# Patient Record
Sex: Female | Born: 1985 | Hispanic: No | Marital: Married | Smoking: Never smoker
Health system: Southern US, Community
[De-identification: ages and names within clinical notes are randomized; demographics above are authoritative.]

## PROBLEM LIST (undated history)

## (undated) ENCOUNTER — Inpatient Hospital Stay (HOSPITAL_COMMUNITY): Payer: Self-pay

## (undated) DIAGNOSIS — E119 Type 2 diabetes mellitus without complications: Secondary | ICD-10-CM

## (undated) DIAGNOSIS — R1110 Vomiting, unspecified: Secondary | ICD-10-CM

## (undated) HISTORY — PX: DILATION AND CURETTAGE OF UTERUS: SHX78

## (undated) HISTORY — DX: Type 2 diabetes mellitus without complications: E11.9

---

## 2014-09-04 LAB — CYTOLOGY - PAP: PAP SMEAR: NEGATIVE

## 2014-09-04 LAB — OB RESULTS CONSOLE VARICELLA ZOSTER ANTIBODY, IGG: VARICELLA IGG: IMMUNE

## 2014-10-13 ENCOUNTER — Emergency Department (HOSPITAL_COMMUNITY)
Admission: EM | Admit: 2014-10-13 | Discharge: 2014-10-14 | Disposition: A | Discharge status: Home / Self Care | Payer: Medicaid Other | Source: Home / Self Care | Attending: Emergency Medicine | Admitting: Emergency Medicine

## 2014-10-13 ENCOUNTER — Emergency Department (HOSPITAL_COMMUNITY): Payer: Medicaid Other

## 2014-10-13 ENCOUNTER — Encounter (HOSPITAL_COMMUNITY): Payer: Self-pay | Admitting: Emergency Medicine

## 2014-10-13 DIAGNOSIS — Z3490 Encounter for supervision of normal pregnancy, unspecified, unspecified trimester: Secondary | ICD-10-CM

## 2014-10-13 DIAGNOSIS — O9989 Other specified diseases and conditions complicating pregnancy, childbirth and the puerperium: Secondary | ICD-10-CM

## 2014-10-13 DIAGNOSIS — R109 Unspecified abdominal pain: Secondary | ICD-10-CM | POA: Diagnosis present

## 2014-10-13 DIAGNOSIS — Z3A01 Less than 8 weeks gestation of pregnancy: Secondary | ICD-10-CM | POA: Insufficient documentation

## 2014-10-13 DIAGNOSIS — N898 Other specified noninflammatory disorders of vagina: Secondary | ICD-10-CM | POA: Diagnosis not present

## 2014-10-13 DIAGNOSIS — R1084 Generalized abdominal pain: Secondary | ICD-10-CM

## 2014-10-13 DIAGNOSIS — R12 Heartburn: Secondary | ICD-10-CM | POA: Diagnosis not present

## 2014-10-13 DIAGNOSIS — O26899 Other specified pregnancy related conditions, unspecified trimester: Secondary | ICD-10-CM

## 2014-10-13 DIAGNOSIS — O26891 Other specified pregnancy related conditions, first trimester: Secondary | ICD-10-CM | POA: Diagnosis not present

## 2014-10-13 LAB — COMPREHENSIVE METABOLIC PANEL
ALK PHOS: 51 U/L (ref 38–126)
ALT: 13 U/L — AB (ref 14–54)
ANION GAP: 6 (ref 5–15)
AST: 18 U/L (ref 15–41)
Albumin: 4.1 g/dL (ref 3.5–5.0)
BILIRUBIN TOTAL: 0.5 mg/dL (ref 0.3–1.2)
BUN: 10 mg/dL (ref 6–20)
CALCIUM: 9 mg/dL (ref 8.9–10.3)
CO2: 23 mmol/L (ref 22–32)
CREATININE: 0.57 mg/dL (ref 0.44–1.00)
Chloride: 106 mmol/L (ref 101–111)
Glucose, Bld: 154 mg/dL — ABNORMAL HIGH (ref 65–99)
Potassium: 3.4 mmol/L — ABNORMAL LOW (ref 3.5–5.1)
SODIUM: 135 mmol/L (ref 135–145)
TOTAL PROTEIN: 7.9 g/dL (ref 6.5–8.1)

## 2014-10-13 LAB — TYPE AND SCREEN
ABO/RH(D): O POS
ANTIBODY SCREEN: NEGATIVE

## 2014-10-13 LAB — CBC
HCT: 35.4 % — ABNORMAL LOW (ref 36.0–46.0)
HEMOGLOBIN: 11.6 g/dL — AB (ref 12.0–15.0)
MCH: 27.8 pg (ref 26.0–34.0)
MCHC: 32.8 g/dL (ref 30.0–36.0)
MCV: 84.7 fL (ref 78.0–100.0)
PLATELETS: 263 10*3/uL (ref 150–400)
RBC: 4.18 MIL/uL (ref 3.87–5.11)
RDW: 12.7 % (ref 11.5–15.5)
WBC: 7.9 10*3/uL (ref 4.0–10.5)

## 2014-10-13 LAB — HCG, QUANTITATIVE, PREGNANCY: HCG, BETA CHAIN, QUANT, S: 27173 m[IU]/mL — AB (ref <5)

## 2014-10-13 LAB — LIPASE, BLOOD: Lipase: 23 U/L (ref 22–51)

## 2014-10-13 MED ORDER — ONDANSETRON HCL 4 MG/2ML IJ SOLN
4.0000 mg | Freq: Once | INTRAMUSCULAR | Status: AC
  Administered 2014-10-13: 4 mg via INTRAVENOUS
  Filled 2014-10-13: qty 2

## 2014-10-13 MED ORDER — ACETAMINOPHEN 325 MG PO TABS
650.0000 mg | ORAL_TABLET | Freq: Once | ORAL | Status: AC
  Administered 2014-10-13: 650 mg via ORAL
  Filled 2014-10-13: qty 2

## 2014-10-13 NOTE — ED Provider Notes (Signed)
CSN: [id number]     Arrival date & time 10/13/14  1934 History   First MD Initiated Contact with Patient 10/13/14 1953     Chief Complaint  Patient presents with  . Abdominal Pain     (Consider location/radiation/quality/duration/timing/severity/associated sxs/prior Treatment) HPI  29 year old who presents with abdominal pain. She is G6 P4 at about 4-[redacted] weeks gestational age and otherwise healthy without prior abdominal surgeries. She reports that for the past 3-4 days she has had generalized abdominal discomfort with nausea. She has not had any fever, chills, night sweats, vomiting, diarrhea, dysuria, urinary frequency, vaginal discharge or vaginal bleeding. She reports having similar symptoms with a prior pregnancy, and wanted to make sure everything was okay. She is not currently established prenatal care for her current pregnancy, and had a positive urine pregnancy test at home recently.  History reviewed. No pertinent past medical history. History reviewed. No pertinent past surgical history. History reviewed. No pertinent family history. Social History  Substance Use Topics  . Smoking status: Never Smoker   . Smokeless tobacco: None  . Alcohol Use: No   OB History    No data available     Review of Systems 10/14 systems reviewed and are negative other than those stated in the HPI  Allergies  Review of patient's allergies indicates no known allergies.  Home Medications   Prior to Admission medications   Not on File   BP 90/50 mmHg  Pulse 73  Temp(Src) 98.5 F (36.9 C) (Oral)  Resp 20  SpO2 100%  LMP  (Approximate) Physical Exam Physical Exam  Nursing note and vitals reviewed. Constitutional: Well developed, well nourished, non-toxic, and in no acute distress Head: Normocephalic and atraumatic.  Mouth/Throat: Oropharynx is clear and moist.  Neck: Normal range of motion. Neck supple.  Cardiovascular: Normal rate and regular rhythm.   Pulmonary/Chest: Effort  normal and breath sounds normal.  Abdominal: Soft. There diffuse/generalized tenderness. There is no rebound and no guarding.  Musculoskeletal: Normal range of motion.  Neurological: Alert, no facial droop, fluent speech, moves all extremities symmetrically Skin: Skin is warm and dry.  Psychiatric: Cooperative  ED Course  Procedures (including critical care time) Labs Review Labs Reviewed  COMPREHENSIVE METABOLIC PANEL - Abnormal; Notable for the following:    Potassium 3.4 (*)    Glucose, Bld 154 (*)    ALT 13 (*)    All other components within normal limits  CBC - Abnormal; Notable for the following:    Hemoglobin 11.6 (*)    HCT 35.4 (*)    All other components within normal limits  HCG, QUANTITATIVE, PREGNANCY - Abnormal; Notable for the following:    hCG, Beta Chain, Sharene Butters, S [id number] (*)    All other components within normal limits  LIPASE, BLOOD  URINALYSIS, ROUTINE W REFLEX MICROSCOPIC (NOT AT [facility])  TYPE AND SCREEN  ABO/RH    Imaging Review US Ob Comp Less 14 Wks  10/13/2014   CLINICAL DATA:  Acute onset of generalized abdominal pain. Initial encounter.  EXAM: OBSTETRIC <14 WK [place] AND TRANSVAGINAL OB US  TECHNIQUE: Both transabdominal and transvaginal ultrasound examinations were performed for complete evaluation of the gestation as well as the maternal uterus, adnexal regions, and pelvic cul-de-sac. Transvaginal technique was performed to assess early pregnancy.  COMPARISON:  None.  FINDINGS: Intrauterine gestational sac: Visualized/normal in shape.  Yolk sac:  Yes  Embryo:  Yes  Cardiac Activity: Yes  Heart Rate: 118  bpm  CRL:  4.4  mm   6 w   1 d                  [place] EDC: 06/07/2014  Maternal uterus/adnexae: No subchorionic hemorrhage is noted. The uterus is otherwise unremarkable.  The ovaries are unremarkable in appearance. The right ovary measures 3.1 x 1.7 x 1.1 cm, while the left ovary measures 2.5 x 2.2 x 2.0 cm. No suspicious adnexal masses are seen; there is no evidence  for ovarian torsion.  No free fluid is seen within the pelvic cul-de-sac.  IMPRESSION: Single live intrauterine pregnancy noted, with a crown-rump length of 4 mm, corresponding to a gestational age of [redacted] weeks 1 day. This matches the gestational age of [redacted] weeks 0 days by LMP, reflecting an estimated date of delivery of June 01, 2015.   Electronically Signed   By: Roanna Raider M.D.   On: 10/13/2014 22:55   US Ob Transvaginal  10/13/2014   CLINICAL DATA:  Acute onset of generalized abdominal pain. Initial encounter.  EXAM: OBSTETRIC <14 WK [place] AND TRANSVAGINAL OB US  TECHNIQUE: Both transabdominal and transvaginal ultrasound examinations were performed for complete evaluation of the gestation as well as the maternal uterus, adnexal regions, and pelvic cul-de-sac. Transvaginal technique was performed to assess early pregnancy.  COMPARISON:  None.  FINDINGS: Intrauterine gestational sac: Visualized/normal in shape.  Yolk sac:  Yes  Embryo:  Yes  Cardiac Activity: Yes  Heart Rate: 118  bpm  CRL:  4.4  mm   6 w   1 d                  [place] EDC: 06/07/2014  Maternal uterus/adnexae: No subchorionic hemorrhage is noted. The uterus is otherwise unremarkable.  The ovaries are unremarkable in appearance. The right ovary measures 3.1 x 1.7 x 1.1 cm, while the left ovary measures 2.5 x 2.2 x 2.0 cm. No suspicious adnexal masses are seen; there is no evidence for ovarian torsion.  No free fluid is seen within the pelvic cul-de-sac.  IMPRESSION: Single live intrauterine pregnancy noted, with a crown-rump length of 4 mm, corresponding to a gestational age of [redacted] weeks 1 day. This matches the gestational age of [redacted] weeks 0 days by LMP, reflecting an estimated date of delivery of June 01, 2015.   Electronically Signed   By: Roanna Raider M.D.   On: 10/13/2014 22:55   I have personally reviewed and evaluated these images and lab results as part of my medical decision-making.    MDM   Final diagnoses:  Intrauterine pregnancy   Generalized abdominal pain    29 year old female G6 P4 and reported [redacted] weeks gestational age who presents with generalized abdominal pain and nausea. She is nontoxic in no acute distress with normal vital signs on arrival. She has a soft and benign abdomen. Basic blood work including CBC, comprehensive metabolic panel, lipase, and urinalysis are unremarkable. Pelvic ultrasound was obtained to rule out ectopic pregnancy, and she has a IUP at 6 weeks and one day without any other acute intrapelvic processes. No evidence of vaginal bleeding. On reevaluation she continues to have a benign abdomen with symptoms improved after Tylenol and Zofran. She is felt appropriate for discharge home. Given referral for OB follow-up to establish prenatal care. Strict return and follow-up instructions are reviewed. She expressed understanding of all discharge instructions for comfortable to plan of care.    Lavera Guise, MD 10/13/14 [phone]

## 2014-10-13 NOTE — Discharge Instructions (Signed)
You have a baby in your uterus at 6 weeks and one day. Please return without fail for worsening symptoms, including worsening pain, vomiting unable to keep down fluids, fever, vaginal bleeding, or any other symptoms concerning to you.  Abdominal Pain During Pregnancy Belly (abdominal) pain is common during pregnancy. Most of the time, it is not a serious problem. Other times, it can be a sign that something is wrong with the pregnancy. Always tell your doctor if you have belly pain. HOME CARE Monitor your belly pain for any changes. The following actions may help you feel better:  Do not have sex (intercourse) or put anything in your vagina until you feel better.  Rest until your pain stops.  Drink clear fluids if you feel sick to your stomach (nauseous). Do not eat solid food until you feel better.  Only take medicine as told by your doctor.  Keep all doctor visits as told. GET HELP RIGHT AWAY IF:   You are bleeding, leaking fluid, or pieces of tissue come out of your vagina.  You have more pain or cramping.  You keep throwing up (vomiting).  You have pain when you pee (urinate) or have blood in your pee.  You have a fever.  You do not feel your baby moving as much.  You feel very weak or feel like passing out.  You have trouble breathing, with or without belly pain.  You have a very bad headache and belly pain.  You have fluid leaking from your vagina and belly pain.  You keep having watery poop (diarrhea).  Your belly pain does not go away after resting, or the pain gets worse. MAKE SURE YOU:   Understand these instructions.  Will watch your condition.  Will get help right away if you are not doing well or get worse. Document Released: 01/20/2009 Document Revised: 10/04/2012 Document Reviewed: 08/31/2012 [facility] Patient Information 2015 [place], [place]. This information is not intended to replace advice given to you by your health care provider. Make sure you  discuss any questions you have with your health care provider.

## 2014-10-13 NOTE — ED Notes (Signed)
Patient unable to void at this time

## 2014-10-13 NOTE — ED Notes (Signed)
Pt from home c/o sharp  abdominal pain x 4 days ,pt is pregnant and reports her LMP was 1 month ago. She reports sharp pain in lower abdomen. She reports taking a home pregnancy test but has not seen a doctor. Denies vaginal bleeding or discharge. Pt speaks arabic

## 2014-10-13 NOTE — ED Notes (Signed)
Pt transported to US

## 2014-10-13 NOTE — ED Notes (Signed)
Bed: ZO10 Expected date:  Expected time:  Means of arrival:  Comments: t1

## 2014-10-13 NOTE — ED Notes (Signed)
Patient is at [facility] sound, i am not able to take assess patients vitals at this time.

## 2014-10-14 ENCOUNTER — Inpatient Hospital Stay (HOSPITAL_COMMUNITY)
Admission: AD | Admit: 2014-10-14 | Discharge: 2014-10-14 | Disposition: A | Discharge status: Home / Self Care | Payer: Medicaid Other | Source: Ambulatory Visit | Attending: Family Medicine | Admitting: Family Medicine

## 2014-10-14 ENCOUNTER — Encounter (HOSPITAL_COMMUNITY): Payer: Self-pay | Admitting: *Deleted

## 2014-10-14 DIAGNOSIS — N898 Other specified noninflammatory disorders of vagina: Secondary | ICD-10-CM | POA: Insufficient documentation

## 2014-10-14 DIAGNOSIS — O26891 Other specified pregnancy related conditions, first trimester: Secondary | ICD-10-CM

## 2014-10-14 DIAGNOSIS — Z3A01 Less than 8 weeks gestation of pregnancy: Secondary | ICD-10-CM | POA: Insufficient documentation

## 2014-10-14 DIAGNOSIS — R12 Heartburn: Secondary | ICD-10-CM | POA: Diagnosis not present

## 2014-10-14 DIAGNOSIS — O9989 Other specified diseases and conditions complicating pregnancy, childbirth and the puerperium: Secondary | ICD-10-CM | POA: Insufficient documentation

## 2014-10-14 LAB — URINE MICROSCOPIC-ADD ON

## 2014-10-14 LAB — URINALYSIS, ROUTINE W REFLEX MICROSCOPIC
Bilirubin Urine: NEGATIVE
GLUCOSE, UA: NEGATIVE mg/dL
HGB URINE DIPSTICK: NEGATIVE
KETONES UR: NEGATIVE mg/dL
Nitrite: NEGATIVE
PROTEIN: NEGATIVE mg/dL
Specific Gravity, Urine: 1.01 (ref 1.005–1.030)
Urobilinogen, UA: 0.2 mg/dL (ref 0.0–1.0)
pH: 5.5 (ref 5.0–8.0)

## 2014-10-14 LAB — ABO/RH: ABO/RH(D): O POS

## 2014-10-14 MED ORDER — FAMOTIDINE 40 MG PO TABS: 40.0000 mg | ORAL_TABLET | Freq: Every day | ORAL | Status: DC

## 2014-10-14 MED ORDER — METOCLOPRAMIDE HCL 5 MG/ML IJ SOLN
10.0000 mg | Freq: Once | INTRAMUSCULAR | Status: AC
  Administered 2014-10-14: 10 mg via INTRAVENOUS
  Filled 2014-10-14: qty 2

## 2014-10-14 MED ORDER — GI COCKTAIL ~~LOC~~
30.0000 mL | Freq: Once | ORAL | Status: AC
  Administered 2014-10-14: 30 mL via ORAL
  Filled 2014-10-14: qty 30

## 2014-10-14 MED ORDER — METOCLOPRAMIDE HCL 10 MG PO TABS
10.0000 mg | ORAL_TABLET | Freq: Once | ORAL | Status: DC
  Filled 2014-10-14: qty 1

## 2014-10-14 MED ORDER — LACTATED RINGERS IV SOLN: INTRAVENOUS | Status: DC

## 2014-10-14 MED ORDER — DEXAMETHASONE SODIUM PHOSPHATE 10 MG/ML IJ SOLN
10.0000 mg | Freq: Once | INTRAMUSCULAR | Status: AC
  Administered 2014-10-14: 10 mg via INTRAVENOUS
  Filled 2014-10-14: qty 1

## 2014-10-14 MED ORDER — LACTATED RINGERS IV SOLN
Freq: Once | INTRAVENOUS | Status: AC
  Administered 2014-10-14: 18:00:00 via INTRAVENOUS
  Filled 2014-10-14: qty 1000

## 2014-10-14 MED ORDER — DEXTROSE 5 % IN LACTATED RINGERS IV BOLUS: 1000.0000 mL | Freq: Once | INTRAVENOUS | Status: DC

## 2014-10-14 NOTE — MAU Provider Note (Signed)
History     CSN: [id number]  Arrival date and time: 10/14/14 1634   First Provider Initiated Contact with Patient 10/14/14 1713      Chief Complaint  Patient presents with  . Abdominal Pain   HPI Comments: 29 yo [id number] @[redacted]w[redacted]d  [place] Arabic, been in country [place] 3 months.   Presents with upper abdominal pain- nausea but no vomiting.  Pt states she has had constipation with last BM 2 days ago, which was hard. Sometimes eating helps the pain. Pt was seen 10/13/2014 (yesterday) with confirmed IUP [redacted]w[redacted]d with HR 118 and normal findings. Pt also had a CBC. CMET and lipase which were all normal. (interpreter present with HPI) Pt also has had some vaginal discharge- denies spotting or bleeding or lower abd cramping  Abdominal Pain This is a new problem. The current episode started yesterday. The onset quality is gradual. The problem occurs constantly. The problem has been unchanged. The pain is located in the LUQ and RUQ. The pain is moderate. The quality of the pain is burning. The abdominal pain does not radiate. Associated symptoms include constipation, headaches, nausea and vomiting. Pertinent negatives include no diarrhea, dysuria or fever. Nothing aggravates the pain. The pain is relieved by nothing. She has tried nothing for the symptoms.    RN note: Job Founds Spurlock-Frizzell, RN Registered Nurse Signed  MAU Note 10/14/2014 4:50 PM    Expand All Collapse All   Severe pain in upper abd, pain started about 7 days ago. Also has a severe headache. Denies vomiting or diarrhea. Is nauseated. Has been dizzy.        Past Medical History  Diagnosis Date  . Medical history non-contributory     Past Surgical History  Procedure Laterality Date  . No past surgeries      History reviewed. No pertinent family history.  Social History  Substance Use Topics  . Smoking status: Never Smoker   . Smokeless tobacco: None  . Alcohol Use: No    Allergies: No Known Allergies  No  prescriptions prior to admission    Review of Systems  Constitutional: Negative for fever and chills.  Gastrointestinal: Positive for heartburn, nausea, vomiting, abdominal pain and constipation. Negative for diarrhea.  Genitourinary: Negative for dysuria and urgency.  Neurological: Positive for dizziness and headaches.   Physical Exam   Blood pressure 93/57, pulse 85, temperature 98.7 F (37.1 C), temperature source Oral, resp. rate 18, last menstrual period 09/01/2014.  Physical Exam  Nursing note and vitals reviewed. Constitutional: She is oriented to person, place, and time. She appears well-developed and well-nourished. No distress.  HENT:  Head: Normocephalic.  Eyes: Pupils are equal, round, and reactive to light.  Neck: Normal range of motion. Neck supple.  Cardiovascular: Normal rate.   Respiratory: Effort normal.  GI: Soft. She exhibits no distension. There is tenderness. There is no rebound and no guarding.  Genitourinary:  Blind swab for GC/chlamydia and wet prep  Musculoskeletal: Normal range of motion.  Neurological: She is alert and oriented to person, place, and time.  Skin: Skin is warm and dry. There is pallor.  Psychiatric: She has a normal mood and affect.     MAU Course  Procedures GI cocktail helped abdominal pain Still has headache and nausea IVF LR with phenergan Decadron 10mg  IV and Reglan IV given Pt dizzy - will give 2nd bag of IVF D5LR   [facility], CNM 2130: Patient's second bag of fluids is done. She is  sleeping comfortably at this time.   Assessment and Plan   1. Heartburn during pregnancy in first trimester    DC home Comfort measures reviewed  1st Trimester precautions  Bleeding precautions Ectopic precautions RX: pepcid 40 mg #30  Return to MAU as needed FU with OB as planned  Follow-up Information    Schedule an appointment as soon as possible for a visit with [facility] DEPT [place].   Contact  information:   [address] [facility] [place] [address] [phone]      Tawnya Crook  8:37 PM 10/14/2014   LINEBERRY,SUSAN 10/14/2014, 5:15 PM

## 2014-10-14 NOTE — MAU Note (Signed)
Severe pain in upper abd, pain started about 7 days ago.  Also has a severe headache.  Denies vomiting or diarrhea.  Is nauseated.  Has been dizzy.

## 2014-10-14 NOTE — MAU Note (Signed)
Has been in the states 3 months

## 2014-10-14 NOTE — ED Notes (Signed)
Patient was alert, oriented and stable upon discharge. RN went over AVS and patient had no further questions. Translator phone used to discharge. MD also spoke to patient with translator phone.

## 2014-10-14 NOTE — Discharge Instructions (Signed)
Heartburn During Pregnancy ° Heartburn happens when stomach acid goes up into the esophagus. The esophagus is the tube between the mouth and the stomach. This acid causes a burning pain in the chest or throat. This happens more often in the later part of pregnancy because the womb (uterus) gets larger. It may also happen because of hormone changes. Heartburn problems often go away after giving birth. °HOME CARE °· Take all medicine as told by your doctor. °· Raise the head of your bed with blocks only as told by your doctor. °· Do not exercise right after eating. °· Avoid eating 2-3 hours before bed. Do not lie down right after eating. °· Eat small meals throughout the day instead of 3 large meals. °· Avoid foods that give you heartburn. Foods you may want to avoid include: °¨ Peppers. °¨ Chocolate. °¨ High-fat foods, including fried foods. °¨ Spicy foods. °¨ Garlic and onions. °¨ Citrus fruits, including oranges, grapefruit, lemons, and limes. °¨ Food containing tomatoes or tomato products. °¨ Mint. °¨ Bubbly (carbonated) drinks and drinks with caffeine. °¨ Vinegar. °GET HELP IF: °· You have any belly (abdominal) pain. °· You feel burning in your upper belly or chest, especially after eating or lying down. °· You feel sick to your stomach (nauseous) and throw up (vomit). °· Your stomach feels upset after you eat. °GET HELP RIGHT AWAY IF: °· You have bad chest pain that goes down your arm or into your jaw or neck. °· You feel sweaty, dizzy, or light-headed. °· You have trouble breathing. °· You throw up blood. °· You have trouble or pain when swallowing. °· You have bloody or black poop (stool). °· You have heartburn more than 3 times a week, for more than 2 weeks. °MAKE SURE YOU: °· Understand these instructions. °· Will watch your condition. °· Will get help right away if you are not doing well or get worse. °Document Released: 03/06/2010 Document Revised: 02/06/2013 Document Reviewed: 09/20/2012 °[employer]®  Patient Information ©2015 [employer]. This information is not intended to replace advice given to you by your health care provider. Make sure you discuss any questions you have with your health care provider. ° °

## 2014-10-14 NOTE — MAU Note (Signed)
Did not check temp, feels hot and 'eyes are burning'

## 2014-10-17 ENCOUNTER — Inpatient Hospital Stay (HOSPITAL_COMMUNITY)
Admission: AD | Admit: 2014-10-17 | Discharge: 2014-10-17 | Disposition: A | Discharge status: Home / Self Care | Payer: Medicaid Other | Source: Ambulatory Visit | Attending: Obstetrics & Gynecology | Admitting: Obstetrics & Gynecology

## 2014-10-17 DIAGNOSIS — Z3A01 Less than 8 weeks gestation of pregnancy: Secondary | ICD-10-CM | POA: Insufficient documentation

## 2014-10-17 DIAGNOSIS — O9989 Other specified diseases and conditions complicating pregnancy, childbirth and the puerperium: Secondary | ICD-10-CM | POA: Diagnosis not present

## 2014-10-17 DIAGNOSIS — O210 Mild hyperemesis gravidarum: Secondary | ICD-10-CM | POA: Diagnosis not present

## 2014-10-17 DIAGNOSIS — R1010 Upper abdominal pain, unspecified: Secondary | ICD-10-CM | POA: Diagnosis present

## 2014-10-17 DIAGNOSIS — R109 Unspecified abdominal pain: Secondary | ICD-10-CM | POA: Diagnosis not present

## 2014-10-17 DIAGNOSIS — R51 Headache: Secondary | ICD-10-CM | POA: Insufficient documentation

## 2014-10-17 DIAGNOSIS — O26899 Other specified pregnancy related conditions, unspecified trimester: Secondary | ICD-10-CM

## 2014-10-17 DIAGNOSIS — O219 Vomiting of pregnancy, unspecified: Secondary | ICD-10-CM

## 2014-10-17 LAB — COMPREHENSIVE METABOLIC PANEL
ALK PHOS: 51 U/L (ref 38–126)
ALT: 42 U/L (ref 14–54)
ANION GAP: 11 (ref 5–15)
AST: 29 U/L (ref 15–41)
Albumin: 4.1 g/dL (ref 3.5–5.0)
BILIRUBIN TOTAL: 0.8 mg/dL (ref 0.3–1.2)
BUN: 9 mg/dL (ref 6–20)
CALCIUM: 9.4 mg/dL (ref 8.9–10.3)
CO2: 22 mmol/L (ref 22–32)
CREATININE: 0.56 mg/dL (ref 0.44–1.00)
Chloride: 101 mmol/L (ref 101–111)
Glucose, Bld: 95 mg/dL (ref 65–99)
Potassium: 3.6 mmol/L (ref 3.5–5.1)
Sodium: 134 mmol/L — ABNORMAL LOW (ref 135–145)
TOTAL PROTEIN: 7.8 g/dL (ref 6.5–8.1)

## 2014-10-17 LAB — CBC
HEMATOCRIT: 35.8 % — AB (ref 36.0–46.0)
HEMOGLOBIN: 12.1 g/dL (ref 12.0–15.0)
MCH: 28.2 pg (ref 26.0–34.0)
MCHC: 33.8 g/dL (ref 30.0–36.0)
MCV: 83.4 fL (ref 78.0–100.0)
Platelets: 279 10*3/uL (ref 150–400)
RBC: 4.29 MIL/uL (ref 3.87–5.11)
RDW: 12.9 % (ref 11.5–15.5)
WBC: 9.5 10*3/uL (ref 4.0–10.5)

## 2014-10-17 LAB — GLUCOSE, CAPILLARY: Glucose-Capillary: 90 mg/dL (ref 65–99)

## 2014-10-17 LAB — URINALYSIS, ROUTINE W REFLEX MICROSCOPIC
Bilirubin Urine: NEGATIVE
GLUCOSE, UA: NEGATIVE mg/dL
HGB URINE DIPSTICK: NEGATIVE
KETONES UR: 15 mg/dL — AB
Leukocytes, UA: NEGATIVE
Nitrite: NEGATIVE
PH: 7 (ref 5.0–8.0)
PROTEIN: NEGATIVE mg/dL
Specific Gravity, Urine: 1.02 (ref 1.005–1.030)
Urobilinogen, UA: 2 mg/dL — ABNORMAL HIGH (ref 0.0–1.0)

## 2014-10-17 MED ORDER — PROMETHAZINE HCL 25 MG/ML IJ SOLN
25.0000 mg | Freq: Once | INTRAVENOUS | Status: AC
  Administered 2014-10-17: 25 mg via INTRAVENOUS
  Filled 2014-10-17: qty 1

## 2014-10-17 MED ORDER — PROMETHAZINE HCL 25 MG PO TABS: 25.0000 mg | ORAL_TABLET | Freq: Four times a day (QID) | ORAL | Status: DC | PRN

## 2014-10-17 MED ORDER — METOCLOPRAMIDE HCL 5 MG/ML IJ SOLN
10.0000 mg | Freq: Once | INTRAMUSCULAR | Status: AC
  Administered 2014-10-17: 10 mg via INTRAVENOUS
  Filled 2014-10-17: qty 2

## 2014-10-17 MED ORDER — ACETAMINOPHEN 650 MG RE SUPP
650.0000 mg | Freq: Once | RECTAL | Status: AC
  Administered 2014-10-17: 650 mg via RECTAL
  Filled 2014-10-17: qty 1

## 2014-10-17 NOTE — MAU Note (Signed)
Discussed discharge instructions with patient and husband via Arabic interpreter.  Verbalized understanding.  Husband voiced concerns about patient having pain come back after IV fluids and medications.  Thressa Sheller, CNM at bedside to answer questions for patient and husband via interpreter.  Verbalized understanding and discharged to home with printed AVS.  Patient to pick up Rx tomorrow.

## 2014-10-17 NOTE — MAU Note (Signed)
Patient was seen in MAU on 10/14/14 for upper abdominal pain and returns today with the same c/o upper abdominal pain and nausea.  Denies vaginal bleeding and denies lower abdominal pain.  Pt speaks Arabic and husband translating.  Will call interpreter.

## 2014-10-17 NOTE — Discharge Instructions (Signed)
Get your prescription for phenergan at the pharmacy. Can use 1/2 tablet if that works to resolve your symptoms. Can use the tablets in the rectum if you are not able to keep them down by mouth.  Start prenatal care as soon as possible. Eat small, frequent amounts to decrease the nausea.

## 2014-10-17 NOTE — MAU Provider Note (Signed)
History     CSN: [id number]  Arrival date and time: 10/17/14 1601   First Provider Initiated Contact with Patient 10/17/14 1647      Chief Complaint  Patient presents with  . Abdominal Pain   HPI Bonnie [name] 29 y.o. [redacted] weeks gestation   Was here on 10-14-14 and seen in MAU with upper abdominal pain.  She has taken the prescribed pepcid for 2 days and the upper abdominal pain has not improved.  She has constant upper abdominal pain which periodically worsens for a few minutes.  She is having nausea and has vomited twice today.  She continues to have a headache today. She has not taken any additional food since she vomited near lunch.  She states she had pain similar to this with her last pregnancy but this pain is much worse.  No BM today.  Denies any diarrhea and vaginal bleeding.  She is concerned as she is not able to take care of her children while she is having this pain.  Chart notes, labs, and ultrasound reviewed from visit on 10-14-14.  Language line used for entire provider exam and interview.  OB History    Gravida Para Term Preterm AB TAB SAB Ectopic Multiple Living   6 4 4  1     4       Past Medical History  Diagnosis Date  . Medical history non-contributory     Past Surgical History  Procedure Laterality Date  . No past surgeries      No family history on file.  Social History  Substance Use Topics  . Smoking status: Never Smoker   . Smokeless tobacco: Not on file  . Alcohol Use: No    Allergies: No Known Allergies  Prescriptions prior to admission  Medication Sig Dispense Refill Last Dose  . famotidine (PEPCID) 40 MG tablet Take 1 tablet (40 mg total) by mouth daily. 30 tablet 1 10/17/2014 at Unknown time    Review of Systems  Constitutional: Negative for fever.  Gastrointestinal: Positive for nausea, vomiting and abdominal pain. Negative for diarrhea.  Genitourinary:       No vaginal discharge. No vaginal bleeding. No dysuria.  Neurological: Positive  for headaches.   Physical Exam   Blood pressure 99/55, pulse 74, temperature 99 F (37.2 C), temperature source Oral, resp. rate 16, height 5' 4.57" (1.64 m), weight 139 lb 3.2 oz (63.141 kg), last menstrual period 09/01/2014, SpO2 100 %.  Physical Exam  Nursing note and vitals reviewed. Constitutional: She is oriented to person, place, and time. She appears well-developed and well-nourished. She appears distressed.  HENT:  Head: Normocephalic.  Eyes: EOM are normal.  Neck: Neck supple.  GI: Soft. There is tenderness. There is no rebound and no guarding.  Pain is in central upper abdomen and extends to each side to midclavicular line.  No burning behind the sternum.  Musculoskeletal: Normal range of motion.  Neurological: She is alert and oriented to person, place, and time.  Skin: Skin is warm and dry.  Psychiatric: She has a normal mood and affect.    MAU Course  Procedures Results for orders placed or performed during the hospital encounter of 10/17/14 (from the past 24 hour(s))  Urinalysis, Routine w reflex microscopic (not at [facility])     Status: Abnormal   Collection Time: 10/17/14  4:10 PM  Result Value Ref Range   Color, Urine YELLOW YELLOW   APPearance CLEAR CLEAR   Specific Gravity, Urine 1.020 1.005 -  1.030   pH 7.0 5.0 - 8.0   Glucose, UA NEGATIVE NEGATIVE mg/dL   Hgb urine dipstick NEGATIVE NEGATIVE   Bilirubin Urine NEGATIVE NEGATIVE   Ketones, ur 15 (A) NEGATIVE mg/dL   Protein, ur NEGATIVE NEGATIVE mg/dL   Urobilinogen, UA 2.0 (H) 0.0 - 1.0 mg/dL   Nitrite NEGATIVE NEGATIVE   Leukocytes, UA NEGATIVE NEGATIVE  Glucose, capillary     Status: None   Collection Time: 10/17/14  5:17 PM  Result Value Ref Range   Glucose-Capillary 90 65 - 99 mg/dL  CBC     Status: Abnormal   Collection Time: 10/17/14  5:30 PM  Result Value Ref Range   WBC 9.5 4.0 - 10.5 K/uL   RBC 4.29 3.87 - 5.11 MIL/uL   Hemoglobin 12.1 12.0 - 15.0 g/dL   HCT 16.1 (L) 09.6 - 04.5 %   MCV  83.4 78.0 - 100.0 fL   MCH 28.2 26.0 - 34.0 pg   MCHC 33.8 30.0 - 36.0 g/dL   RDW 40.9 81.1 - 91.4 %   Platelets 279 150 - 400 K/uL  Comprehensive metabolic panel     Status: Abnormal   Collection Time: 10/17/14  5:30 PM  Result Value Ref Range   Sodium 134 (L) 135 - 145 mmol/L   Potassium 3.6 3.5 - 5.1 mmol/L   Chloride 101 101 - 111 mmol/L   CO2 22 22 - 32 mmol/L   Glucose, Bld 95 65 - 99 mg/dL   BUN 9 6 - 20 mg/dL   Creatinine, Ser 7.82 0.44 - 1.00 mg/dL   Calcium 9.4 8.9 - 95.6 mg/dL   Total Protein 7.8 6.5 - 8.1 g/dL   Albumin 4.1 3.5 - 5.0 g/dL   AST 29 15 - 41 U/L   ALT 42 14 - 54 U/L   Alkaline Phosphatase 51 38 - 126 U/L   Total Bilirubin 0.8 0.3 - 1.2 mg/dL   GFR calc non Af Amer >60 >60 mL/min   GFR calc Af Amer >60 >60 mL/min   Anion gap 11 5 - 15    MDM CBG today is 90.  Explained via language line - IVF with phenergan, reglan and acetaminophen suppository. Reviewed plan with Dr. Adrian Blackwater.  1930  Abdominal pain has resolved with Phenergan and reglan.  Slept with IVF.   Assessment and Plan  Upper abdominal pain possibly related to morning sickness Vomiting in early pregnancy Headache  Plan Will go home Try to eat small frequent amounts. Continue Pepcid. Rx Phenergan 25 mg One by mouth every 6 hours.  May use 1/2 tablet if that helps her symptoms.  May use in the rectum if vomiting. Begin prenatal care as soon as possible. Tal Neer 10/17/2014, 6:00 PM

## 2014-10-23 ENCOUNTER — Inpatient Hospital Stay (HOSPITAL_COMMUNITY)
Admission: AD | Admit: 2014-10-23 | Discharge: 2014-10-23 | Disposition: A | Discharge status: Home / Self Care | Payer: Medicaid Other | Source: Ambulatory Visit | Attending: Obstetrics and Gynecology | Admitting: Obstetrics and Gynecology

## 2014-10-23 ENCOUNTER — Encounter (HOSPITAL_COMMUNITY): Payer: Self-pay | Admitting: Student

## 2014-10-23 DIAGNOSIS — O219 Vomiting of pregnancy, unspecified: Secondary | ICD-10-CM

## 2014-10-23 DIAGNOSIS — Z3A01 Less than 8 weeks gestation of pregnancy: Secondary | ICD-10-CM | POA: Diagnosis not present

## 2014-10-23 DIAGNOSIS — O210 Mild hyperemesis gravidarum: Secondary | ICD-10-CM | POA: Insufficient documentation

## 2014-10-23 DIAGNOSIS — R1010 Upper abdominal pain, unspecified: Secondary | ICD-10-CM | POA: Diagnosis present

## 2014-10-23 LAB — COMPREHENSIVE METABOLIC PANEL
ALBUMIN: 4.2 g/dL (ref 3.5–5.0)
ALT: 35 U/L (ref 14–54)
AST: 19 U/L (ref 15–41)
Alkaline Phosphatase: 48 U/L (ref 38–126)
Anion gap: 8 (ref 5–15)
BUN: 9 mg/dL (ref 6–20)
CHLORIDE: 102 mmol/L (ref 101–111)
CO2: 24 mmol/L (ref 22–32)
Calcium: 9.2 mg/dL (ref 8.9–10.3)
Creatinine, Ser: 0.45 mg/dL (ref 0.44–1.00)
GFR calc Af Amer: >60 mL/min (ref >60)
GFR calc non Af Amer: >60 mL/min (ref >60)
GLUCOSE: 98 mg/dL (ref 65–99)
POTASSIUM: 3.6 mmol/L (ref 3.5–5.1)
Sodium: 134 mmol/L — ABNORMAL LOW (ref 135–145)
Total Bilirubin: 0.6 mg/dL (ref 0.3–1.2)
Total Protein: 7.7 g/dL (ref 6.5–8.1)

## 2014-10-23 LAB — URINE MICROSCOPIC-ADD ON

## 2014-10-23 LAB — URINALYSIS, ROUTINE W REFLEX MICROSCOPIC
BILIRUBIN URINE: NEGATIVE
Glucose, UA: NEGATIVE mg/dL
HGB URINE DIPSTICK: NEGATIVE
Ketones, ur: 15 mg/dL — AB
NITRITE: NEGATIVE
PROTEIN: NEGATIVE mg/dL
Specific Gravity, Urine: 1.01 (ref 1.005–1.030)
UROBILINOGEN UA: 1 mg/dL (ref 0.0–1.0)
pH: 7 (ref 5.0–8.0)

## 2014-10-23 LAB — AMYLASE: Amylase: 49 U/L (ref 28–100)

## 2014-10-23 LAB — CBC
HEMATOCRIT: 34.7 % — AB (ref 36.0–46.0)
Hemoglobin: 12 g/dL (ref 12.0–15.0)
MCH: 28.5 pg (ref 26.0–34.0)
MCHC: 34.6 g/dL (ref 30.0–36.0)
MCV: 82.4 fL (ref 78.0–100.0)
PLATELETS: 257 10*3/uL (ref 150–400)
RBC: 4.21 MIL/uL (ref 3.87–5.11)
RDW: 12.8 % (ref 11.5–15.5)
WBC: 8.9 10*3/uL (ref 4.0–10.5)

## 2014-10-23 LAB — LIPASE, BLOOD: Lipase: 26 U/L (ref 22–51)

## 2014-10-23 MED ORDER — METOCLOPRAMIDE HCL 5 MG/ML IJ SOLN
10.0000 mg | Freq: Once | INTRAMUSCULAR | Status: AC
  Administered 2014-10-23: 10 mg via INTRAVENOUS
  Filled 2014-10-23: qty 2

## 2014-10-23 MED ORDER — FAMOTIDINE IN NACL 20-0.9 MG/50ML-% IV SOLN
20.0000 mg | Freq: Once | INTRAVENOUS | Status: AC
  Administered 2014-10-23: 20 mg via INTRAVENOUS
  Filled 2014-10-23: qty 50

## 2014-10-23 MED ORDER — METOCLOPRAMIDE HCL 10 MG PO TABS
10.0000 mg | ORAL_TABLET | Freq: Four times a day (QID) | ORAL | Status: DC
Start: 2014-10-23 — End: 2015-04-28

## 2014-10-23 MED ORDER — LACTATED RINGERS IV BOLUS (SEPSIS)
1000.0000 mL | Freq: Once | INTRAVENOUS | Status: AC
  Administered 2014-10-23: 1000 mL via INTRAVENOUS

## 2014-10-23 MED ORDER — PROMETHAZINE HCL 25 MG/ML IJ SOLN
25.0000 mg | Freq: Once | INTRAMUSCULAR | Status: AC
  Administered 2014-10-23: 25 mg via INTRAVENOUS
  Filled 2014-10-23: qty 1

## 2014-10-23 NOTE — Discharge Instructions (Signed)
Morning Sickness °Morning sickness is when you feel sick to your stomach (nauseous) during pregnancy. You may feel sick to your stomach and throw up (vomit). You may feel sick in the morning, but you can feel this way any time of day. Some women feel very sick to their stomach and cannot stop throwing up (hyperemesis gravidarum). °HOME CARE °· Only take medicines as told by your doctor. °· Take multivitamins as told by your doctor. Taking multivitamins before getting pregnant can stop or lessen the harshness of morning sickness. °· Eat dry toast or unsalted crackers before getting out of bed. °· Eat 5 to 6 small meals a day. °· Eat dry and bland foods like rice and baked potatoes. °· Do not drink liquids with meals. Drink between meals. °· Do not eat greasy, fatty, or spicy foods. °· Have someone cook for you if the smell of food causes you to feel sick or throw up. °· If you feel sick to your stomach after taking prenatal vitamins, take them at night or with a snack. °· Eat protein when you need a snack (nuts, yogurt, cheese). °· Eat unsweetened gelatins for dessert. °· Wear a bracelet used for sea sickness (acupressure wristband). °· Go to a doctor that puts thin needles into certain body points (acupuncture) to improve how you feel. °· Do not smoke. °· Use a humidifier to keep the air in your house free of odors. °· Get lots of fresh air. °GET HELP IF: °· You need medicine to feel better. °· You feel dizzy or lightheaded. °· You are losing weight. °GET HELP RIGHT AWAY IF:  °· You feel very sick to your stomach and cannot stop throwing up. °· You pass out (faint). °MAKE SURE YOU: °· Understand these instructions. °· Will watch your condition. °· Will get help right away if you are not doing well or get worse. °Document Released: 03/11/2004 Document Revised: 02/06/2013 Document Reviewed: 07/19/2012 °[employer]® Patient Information ©2015 [employer]. This information is not intended to replace advice given to you by  your health care provider. Make sure you discuss any questions you have with your health care provider. ° °Eating Plan for Hyperemesis Gravidarum °Severe cases of hyperemesis gravidarum can lead to dehydration and malnutrition. The hyperemesis eating plan is one way to lessen the symptoms of nausea and vomiting. It is often used with prescribed medicines to control your symptoms.  °WHAT CAN I DO TO RELIEVE MY SYMPTOMS? °Listen to your body. Everyone is different and has different preferences. Find what works best for you. Some of the following things may help: °· Eat and drink slowly. °· Eat 5-6 small meals daily instead of 3 large meals.   °· Eat crackers before you get out of bed in the morning.   °· Starchy foods are usually well tolerated (such as cereal, toast, bread, potatoes, pasta, rice, and pretzels).   °· Ginger may help with nausea. Add ¼ tsp ground ginger to hot tea or choose ginger tea.   °· Try drinking 100% fruit juice or an electrolyte drink. °· Continue to take your prenatal vitamins as directed by your health care provider. If you are having trouble taking your prenatal vitamins, talk with your health care provider about different options. °· Include at least 1 serving of protein with your meals and snacks (such as meats or poultry, beans, nuts, eggs, or yogurt). Try eating a protein-rich snack before bed (such as cheese and crackers or a half turkey or peanut butter sandwich). °WHAT THINGS SHOULD I   AVOID TO REDUCE MY SYMPTOMS? °The following things may help reduce your symptoms: °· Avoid foods with strong smells. Try eating meals in well-ventilated areas that are free of odors. °· Avoid drinking water or other beverages with meals. Try not to drink anything less than 30 minutes before and after meals. °· Avoid drinking more than 1 cup of fluid at a time. °· Avoid fried or high-fat foods, such as butter and cream sauces. °· Avoid spicy foods. °· Avoid skipping meals the best you can. Nausea can be  more intense on an empty stomach. If you cannot tolerate food at that time, do not force it. Try sucking on ice chips or other frozen items and make up the calories later. °· Avoid lying down within 2 hours after eating. °Document Released: 11/29/2006 Document Revised: 02/06/2013 Document Reviewed: 12/06/2012 °[employer]® Patient Information ©2015 [employer]. This information is not intended to replace advice given to you by your health care provider. Make sure you discuss any questions you have with your health care provider. ° °

## 2014-10-23 NOTE — MAU Note (Signed)
Pt. Is here for stomach pain, headache and back ache.

## 2014-10-23 NOTE — MAU Provider Note (Signed)
History     CSN: [id number]  Arrival date and time: 10/23/14 4098   First Provider Initiated Contact with Patient 10/23/14 1930      Chief Complaint  Patient presents with  . Abdominal Pain  . Emesis During Pregnancy   HPI Bonnie [name] is a 29 y.o. [id number] at [redacted]w[redacted]d by ultrasound who presents with upper abdominal pain, nausea, & vomiting.  States pain is same type of pain as she was previously seen for but now is worse. Has been taking pepcid & phenergan as prescribed. Vomited 3 times today.  Rates pain as 7/10 & describes as constant and sharp.  Denies chest pain or SOB.  Denies Vaginal bleeding or discharge.  Denies lower abdominal pain.    Patient speak Arabic; used language line.    Past Medical History  Diagnosis Date  . Medical history non-contributory     Past Surgical History  Procedure Laterality Date  . No past surgeries      No family history on file.  Social History  Substance Use Topics  . Smoking status: Never Smoker   . Smokeless tobacco: None  . Alcohol Use: No    Allergies: No Known Allergies  Prescriptions prior to admission  Medication Sig Dispense Refill Last Dose  . famotidine (PEPCID) 40 MG tablet Take 1 tablet (40 mg total) by mouth daily. 30 tablet 1 10/17/2014 at Unknown time  . promethazine (PHENERGAN) 25 MG tablet Take 1 tablet (25 mg total) by mouth every 6 (six) hours as needed for nausea or vomiting. 30 tablet 0     Review of Systems  Constitutional: Positive for chills. Negative for fever.  Respiratory: Negative.   Cardiovascular: Negative.  Negative for chest pain.  Gastrointestinal: Positive for nausea, vomiting, abdominal pain and constipation. Negative for heartburn and diarrhea.  Genitourinary: Negative for dysuria.       Denies vaginal bleeding or discharge  Musculoskeletal: Positive for back pain.   Physical Exam   Blood pressure 105/61, pulse 75, temperature 98.4 F (36.9 C), temperature source Oral, last menstrual  period 09/01/2014.  Physical Exam  Nursing note and vitals reviewed. Constitutional: She is oriented to person, place, and time. She appears well-developed and well-nourished. She appears distressed.  HENT:  Head: Normocephalic and atraumatic.  Eyes: Conjunctivae are normal. Right eye exhibits no discharge. Left eye exhibits no discharge. No scleral icterus.  Neck: Normal range of motion.  Cardiovascular: Normal rate, regular rhythm and normal heart sounds.   No murmur heard. Respiratory: Effort normal and breath sounds normal. No respiratory distress. She has no wheezes.  GI: Soft. Bowel sounds are normal. She exhibits no distension and no mass. There is tenderness (generalized tenderness to palpation). There is no rebound.  Neurological: She is alert and oriented to person, place, and time.  Skin: Skin is warm and dry. She is not diaphoretic.  Psychiatric: She has a normal mood and affect. Her behavior is normal. Judgment and thought content normal.    MAU Course  Procedures Results for orders placed or performed during the hospital encounter of 10/23/14 (from the past 24 hour(s))  Urinalysis, Routine w reflex microscopic (not at [facility])     Status: Abnormal   Collection Time: 10/23/14  7:50 PM  Result Value Ref Range   Color, Urine YELLOW YELLOW   APPearance CLEAR CLEAR   Specific Gravity, Urine 1.010 1.005 - 1.030   pH 7.0 5.0 - 8.0   Glucose, UA NEGATIVE NEGATIVE mg/dL   Hgb urine dipstick NEGATIVE  NEGATIVE   Bilirubin Urine NEGATIVE NEGATIVE   Ketones, ur 15 (A) NEGATIVE mg/dL   Protein, ur NEGATIVE NEGATIVE mg/dL   Urobilinogen, UA 1.0 0.0 - 1.0 mg/dL   Nitrite NEGATIVE NEGATIVE   Leukocytes, UA SMALL (A) NEGATIVE  Urine microscopic-add on     Status: None   Collection Time: 10/23/14  7:50 PM  Result Value Ref Range   Squamous Epithelial / LPF RARE RARE   WBC, UA 3-6 <3 WBC/hpf   RBC / HPF 0-2 <3 RBC/hpf   Bacteria, UA RARE RARE  CBC     Status: Abnormal   Collection  Time: 10/23/14  8:22 PM  Result Value Ref Range   WBC 8.9 4.0 - 10.5 K/uL   RBC 4.21 3.87 - 5.11 MIL/uL   Hemoglobin 12.0 12.0 - 15.0 g/dL   HCT 93.8 (L) 10.1 - 75.1 %   MCV 82.4 78.0 - 100.0 fL   MCH 28.5 26.0 - 34.0 pg   MCHC 34.6 30.0 - 36.0 g/dL   RDW 02.5 85.2 - 77.8 %   Platelets 257 150 - 400 K/uL  Comprehensive metabolic panel     Status: Abnormal   Collection Time: 10/23/14  8:22 PM  Result Value Ref Range   Sodium 134 (L) 135 - 145 mmol/L   Potassium 3.6 3.5 - 5.1 mmol/L   Chloride 102 101 - 111 mmol/L   CO2 24 22 - 32 mmol/L   Glucose, Bld 98 65 - 99 mg/dL   BUN 9 6 - 20 mg/dL   Creatinine, Ser 2.42 0.44 - 1.00 mg/dL   Calcium 9.2 8.9 - 35.3 mg/dL   Total Protein 7.7 6.5 - 8.1 g/dL   Albumin 4.2 3.5 - 5.0 g/dL   AST 19 15 - 41 U/L   ALT 35 14 - 54 U/L   Alkaline Phosphatase 48 38 - 126 U/L   Total Bilirubin 0.6 0.3 - 1.2 mg/dL   GFR calc non Af Amer >60 >60 mL/min   GFR calc Af Amer >60 >60 mL/min   Anion gap 8 5 - 15  Amylase     Status: None   Collection Time: 10/23/14  8:22 PM  Result Value Ref Range   Amylase 49 28 - 100 U/L  Lipase, blood     Status: None   Collection Time: 10/23/14  8:22 PM  Result Value Ref Range   Lipase 26 22 - 51 U/L    MDM IV fluids with phenergan, pepcid & reglan No vomiting while in MAU Patient sleeping  Assessment and Plan  A: 1. Nausea and vomiting in pregnancy prior to [redacted] weeks gestation    P: Discharge home Rx reglan Continue pepcid & phenergan Urine culture pending  Judeth Horn, NP  10/23/2014, 7:42 PM

## 2014-10-25 LAB — CULTURE, OB URINE

## 2014-10-29 ENCOUNTER — Encounter (HOSPITAL_COMMUNITY): Payer: Self-pay | Admitting: *Deleted

## 2014-10-29 ENCOUNTER — Inpatient Hospital Stay (HOSPITAL_COMMUNITY)
Admission: AD | Admit: 2014-10-29 | Discharge: 2014-10-29 | Disposition: A | Discharge status: Home / Self Care | Payer: Medicaid Other | Source: Ambulatory Visit | Attending: Obstetrics & Gynecology | Admitting: Obstetrics & Gynecology

## 2014-10-29 DIAGNOSIS — R51 Headache: Secondary | ICD-10-CM | POA: Diagnosis present

## 2014-10-29 DIAGNOSIS — Z3A22 22 weeks gestation of pregnancy: Secondary | ICD-10-CM | POA: Insufficient documentation

## 2014-10-29 DIAGNOSIS — R109 Unspecified abdominal pain: Secondary | ICD-10-CM | POA: Diagnosis present

## 2014-10-29 DIAGNOSIS — O26892 Other specified pregnancy related conditions, second trimester: Secondary | ICD-10-CM | POA: Insufficient documentation

## 2014-10-29 DIAGNOSIS — O26891 Other specified pregnancy related conditions, first trimester: Secondary | ICD-10-CM

## 2014-10-29 DIAGNOSIS — O219 Vomiting of pregnancy, unspecified: Secondary | ICD-10-CM

## 2014-10-29 DIAGNOSIS — R112 Nausea with vomiting, unspecified: Secondary | ICD-10-CM | POA: Insufficient documentation

## 2014-10-29 LAB — URINALYSIS, ROUTINE W REFLEX MICROSCOPIC
Bilirubin Urine: NEGATIVE
Glucose, UA: NEGATIVE mg/dL
Hgb urine dipstick: NEGATIVE
Ketones, ur: 15 mg/dL — AB
Nitrite: NEGATIVE
PROTEIN: NEGATIVE mg/dL
SPECIFIC GRAVITY, URINE: 1.025 (ref 1.005–1.030)
UROBILINOGEN UA: 1 mg/dL (ref 0.0–1.0)
pH: 6.5 (ref 5.0–8.0)

## 2014-10-29 LAB — COMPREHENSIVE METABOLIC PANEL
ALBUMIN: 4.1 g/dL (ref 3.5–5.0)
ALK PHOS: 51 U/L (ref 38–126)
ALT: 15 U/L (ref 14–54)
AST: 16 U/L (ref 15–41)
Anion gap: 9 (ref 5–15)
BILIRUBIN TOTAL: 0.5 mg/dL (ref 0.3–1.2)
BUN: 11 mg/dL (ref 6–20)
CALCIUM: 9.2 mg/dL (ref 8.9–10.3)
CO2: 21 mmol/L — ABNORMAL LOW (ref 22–32)
CREATININE: 0.51 mg/dL (ref 0.44–1.00)
Chloride: 101 mmol/L (ref 101–111)
GFR calc Af Amer: >60 mL/min (ref >60)
GFR calc non Af Amer: >60 mL/min (ref >60)
GLUCOSE: 94 mg/dL (ref 65–99)
POTASSIUM: 3.7 mmol/L (ref 3.5–5.1)
Sodium: 131 mmol/L — ABNORMAL LOW (ref 135–145)
TOTAL PROTEIN: 7.5 g/dL (ref 6.5–8.1)

## 2014-10-29 LAB — CBC
HEMATOCRIT: 34.5 % — AB (ref 36.0–46.0)
HEMOGLOBIN: 12.2 g/dL (ref 12.0–15.0)
MCH: 29.2 pg (ref 26.0–34.0)
MCHC: 35.4 g/dL (ref 30.0–36.0)
MCV: 82.5 fL (ref 78.0–100.0)
Platelets: 278 10*3/uL (ref 150–400)
RBC: 4.18 MIL/uL (ref 3.87–5.11)
RDW: 13 % (ref 11.5–15.5)
WBC: 8.9 10*3/uL (ref 4.0–10.5)

## 2014-10-29 LAB — URINE MICROSCOPIC-ADD ON

## 2014-10-29 LAB — LIPASE, BLOOD: Lipase: 26 U/L (ref 22–51)

## 2014-10-29 LAB — AMYLASE: Amylase: 53 U/L (ref 28–100)

## 2014-10-29 LAB — URIC ACID: Uric Acid, Serum: 3.4 mg/dL (ref 2.3–6.6)

## 2014-10-29 MED ORDER — FAMOTIDINE 20 MG PO TABS: 20.0000 mg | ORAL_TABLET | Freq: Two times a day (BID) | ORAL | Status: DC

## 2014-10-29 MED ORDER — SODIUM CHLORIDE 0.9 % IV SOLN
INTRAVENOUS | Status: DC
  Administered 2014-10-29: 18:00:00 via INTRAVENOUS

## 2014-10-29 MED ORDER — DIPHENHYDRAMINE HCL 50 MG/ML IJ SOLN
25.0000 mg | Freq: Once | INTRAMUSCULAR | Status: AC
Start: 2014-10-29 — End: 2014-10-29
  Administered 2014-10-29: 25 mg via INTRAVENOUS
  Filled 2014-10-29: qty 1

## 2014-10-29 MED ORDER — METOCLOPRAMIDE HCL 10 MG PO TABS: 10.0000 mg | ORAL_TABLET | Freq: Four times a day (QID) | ORAL | Status: DC

## 2014-10-29 MED ORDER — METOCLOPRAMIDE HCL 5 MG/ML IJ SOLN
10.0000 mg | Freq: Once | INTRAMUSCULAR | Status: AC
  Administered 2014-10-29: 10 mg via INTRAVENOUS

## 2014-10-29 MED ORDER — DEXAMETHASONE SODIUM PHOSPHATE 10 MG/ML IJ SOLN
10.0000 mg | Freq: Once | INTRAMUSCULAR | Status: AC
  Administered 2014-10-29: 10 mg via INTRAVENOUS
  Filled 2014-10-29: qty 1

## 2014-10-29 MED ORDER — GI COCKTAIL ~~LOC~~
30.0000 mL | Freq: Once | ORAL | Status: DC
  Filled 2014-10-29: qty 30

## 2014-10-29 MED ORDER — BUTALBITAL-APAP-CAFFEINE 50-325-40 MG PO TABS: 1.0000 | ORAL_TABLET | Freq: Four times a day (QID) | ORAL | Status: DC | PRN

## 2014-10-29 MED ORDER — PROMETHAZINE HCL 25 MG PO TABS: 25.0000 mg | ORAL_TABLET | Freq: Four times a day (QID) | ORAL | Status: DC | PRN

## 2014-10-29 NOTE — MAU Provider Note (Signed)
History     CSN: [id number]  Arrival date and time: 10/29/14 1702     Chief Complaint  Patient presents with  . Abdominal Pain  . Headache  . Emesis During Pregnancy   HPI  Bonnie [name] is a 29 y.o. [id number] at [redacted]w[redacted]d who presents with epigastric pain, n/v, & headache.  Reports symptoms have been ongoing for 20 days. Has been seen in MAU twice previously with same symptoms.  Reports that she is out of her medication and last took them this morning.  Has vomited 4 times in the last 24 hours. Also reports headache today; can't get patient to quantify pain. Denies photophobia.  Denies lower abdominal pain or vaginal bleeding.   Speaks Arabic, language line used.    Past Medical History  Diagnosis Date  . Medical history non-contributory     Past Surgical History  Procedure Laterality Date  . No past surgeries      History reviewed. No pertinent family history.  Social History  Substance Use Topics  . Smoking status: Never Smoker   . Smokeless tobacco: None  . Alcohol Use: No    Allergies: No Known Allergies  Prescriptions prior to admission  Medication Sig Dispense Refill Last Dose  . famotidine (PEPCID) 40 MG tablet Take 1 tablet (40 mg total) by mouth daily. 30 tablet 1 10/17/2014 at Unknown time  . metoCLOPramide (REGLAN) 10 MG tablet Take 1 tablet (10 mg total) by mouth every 6 (six) hours. 30 tablet 0   . promethazine (PHENERGAN) 25 MG tablet Take 1 tablet (25 mg total) by mouth every 6 (six) hours as needed for nausea or vomiting. 30 tablet 0     Review of Systems  Constitutional: Negative for fever.  HENT: Negative for tinnitus.   Eyes: Negative for photophobia.  Respiratory: Negative.   Cardiovascular: Negative.   Gastrointestinal: Positive for nausea, vomiting and abdominal pain. Negative for heartburn, diarrhea and constipation.  Genitourinary: Negative for dysuria.       Denies vaginal bleeding  Neurological: Positive for dizziness and headaches.    Physical Exam   Blood pressure 101/58, pulse 75, temperature 98.4 F (36.9 C), temperature source Oral, resp. rate 16, weight 61.508 kg (135 lb 9.6 oz), last menstrual period 09/01/2014.  Physical Exam  Nursing note and vitals reviewed. Constitutional: She is oriented to person, place, and time. She appears well-developed and well-nourished. She appears distressed.  HENT:  Head: Normocephalic and atraumatic.  Eyes: Conjunctivae are normal. Right eye exhibits no discharge. Left eye exhibits no discharge. No scleral icterus.  Neck: Normal range of motion.  Cardiovascular: Normal rate, regular rhythm and normal heart sounds.   No murmur heard. Respiratory: Effort normal and breath sounds normal. No respiratory distress. She has no wheezes.  GI: Soft. Bowel sounds are decreased. There is tenderness in the epigastric area. There is no rigidity, no rebound and no guarding.  Neurological: She is alert and oriented to person, place, and time.  Skin: Skin is warm and dry. She is not diaphoretic.  Psychiatric: She has a normal mood and affect. Her behavior is normal. Judgment and thought content normal.    MAU Course  Procedures Results for orders placed or performed during the hospital encounter of 10/29/14 (from the past 24 hour(s))  Urinalysis, Routine w reflex microscopic (not at [facility])     Status: Abnormal   Collection Time: 10/29/14  5:23 PM  Result Value Ref Range   Color, Urine YELLOW YELLOW   APPearance CLEAR CLEAR  Specific Gravity, Urine 1.025 1.005 - 1.030   pH 6.5 5.0 - 8.0   Glucose, UA NEGATIVE NEGATIVE mg/dL   Hgb urine dipstick NEGATIVE NEGATIVE   Bilirubin Urine NEGATIVE NEGATIVE   Ketones, ur 15 (A) NEGATIVE mg/dL   Protein, ur NEGATIVE NEGATIVE mg/dL   Urobilinogen, UA 1.0 0.0 - 1.0 mg/dL   Nitrite NEGATIVE NEGATIVE   Leukocytes, UA TRACE (A) NEGATIVE  Urine microscopic-add on     Status: Abnormal   Collection Time: 10/29/14  5:23 PM  Result Value Ref Range    Squamous Epithelial / LPF RARE RARE   WBC, UA 0-2 <3 WBC/hpf   RBC / HPF 0-2 <3 RBC/hpf   Bacteria, UA FEW (A) RARE   Urine-Other MUCOUS PRESENT   CBC     Status: Abnormal   Collection Time: 10/29/14  6:00 PM  Result Value Ref Range   WBC 8.9 4.0 - 10.5 K/uL   RBC 4.18 3.87 - 5.11 MIL/uL   Hemoglobin 12.2 12.0 - 15.0 g/dL   HCT 16.1 (L) 09.6 - 04.5 %   MCV 82.5 78.0 - 100.0 fL   MCH 29.2 26.0 - 34.0 pg   MCHC 35.4 30.0 - 36.0 g/dL   RDW 40.9 81.1 - 91.4 %   Platelets 278 150 - 400 K/uL  Comprehensive metabolic panel     Status: Abnormal   Collection Time: 10/29/14  6:00 PM  Result Value Ref Range   Sodium 131 (L) 135 - 145 mmol/L   Potassium 3.7 3.5 - 5.1 mmol/L   Chloride 101 101 - 111 mmol/L   CO2 21 (L) 22 - 32 mmol/L   Glucose, Bld 94 65 - 99 mg/dL   BUN 11 6 - 20 mg/dL   Creatinine, Ser 7.82 0.44 - 1.00 mg/dL   Calcium 9.2 8.9 - 95.6 mg/dL   Total Protein 7.5 6.5 - 8.1 g/dL   Albumin 4.1 3.5 - 5.0 g/dL   AST 16 15 - 41 U/L   ALT 15 14 - 54 U/L   Alkaline Phosphatase 51 38 - 126 U/L   Total Bilirubin 0.5 0.3 - 1.2 mg/dL   GFR calc non Af Amer >60 >60 mL/min   GFR calc Af Amer >60 >60 mL/min   Anion gap 9 5 - 15  Uric acid     Status: None   Collection Time: 10/29/14  6:00 PM  Result Value Ref Range   Uric Acid, Serum 3.4 2.3 - 6.6 mg/dL  Amylase     Status: None   Collection Time: 10/29/14  6:00 PM  Result Value Ref Range   Amylase 53 28 - 100 U/L  Lipase, blood     Status: None   Collection Time: 10/29/14  6:00 PM  Result Value Ref Range   Lipase 26 22 - 51 U/L    MDM IV fluids & headache cocktail - headache resolved.  Ordered GI cocktail; not given d/t patient sleeping.  Patient dit not vomit while in MAU.   Assessment and Plan  A: 1. Nausea and vomiting in pregnancy prior to [redacted] weeks gestation   2. Headache in pregnancy, first trimester    P: Discharge home Call [facility] Dept to schedule prenatal care Refilled rx phenergan, reglan, pepcid Rx  fioricet Urine culture pending   Judeth Horn, NP   10/29/2014, 5:39 PM

## 2014-10-29 NOTE — Discharge Instructions (Signed)
Eating Plan for Hyperemesis Gravidarum Severe cases of hyperemesis gravidarum can lead to dehydration and malnutrition. The hyperemesis eating plan is one way to lessen the symptoms of nausea and vomiting. It is often used with prescribed medicines to control your symptoms.  WHAT CAN I DO TO RELIEVE MY SYMPTOMS? Listen to your body. Everyone is different and has different preferences. Find what works best for you. Some of the following things may help:  Eat and drink slowly.  Eat 5-6 small meals daily instead of 3 large meals.   Eat crackers before you get out of bed in the morning.   Starchy foods are usually well tolerated (such as cereal, toast, bread, potatoes, pasta, rice, and pretzels).   Ginger may help with nausea. Add  tsp ground ginger to hot tea or choose ginger tea.   Try drinking 100% fruit juice or an electrolyte drink.  Continue to take your prenatal vitamins as directed by your health care provider. If you are having trouble taking your prenatal vitamins, talk with your health care provider about different options.  Include at least 1 serving of protein with your meals and snacks (such as meats or poultry, beans, nuts, eggs, or yogurt). Try eating a protein-rich snack before bed (such as cheese and crackers or a half [place] or peanut butter sandwich). WHAT THINGS SHOULD I AVOID TO REDUCE MY SYMPTOMS? The following things may help reduce your symptoms:  Avoid foods with strong smells. Try eating meals in well-ventilated areas that are free of odors.  Avoid drinking water or other beverages with meals. Try not to drink anything less than 30 minutes before and after meals.  Avoid drinking more than 1 cup of fluid at a time.  Avoid fried or high-fat foods, such as butter and cream sauces.  Avoid spicy foods.  Avoid skipping meals the best you can. Nausea can be more intense on an empty stomach. If you cannot tolerate food at that time, do not force it. Try sucking on  ice chips or other frozen items and make up the calories later.  Avoid lying down within 2 hours after eating. Document Released: 11/29/2006 Document Revised: 02/06/2013 Document Reviewed: 12/06/2012 [facility] Patient Information 2015 [place], [place]. This information is not intended to replace advice given to you by your health care provider. Make sure you discuss any questions you have with your health care provider.  General Headache Without Cause A headache is pain or discomfort felt around the head or neck area. The specific cause of a headache may not be found. There are many causes and types of headaches. A few common ones are:  Tension headaches.  Migraine headaches.  Cluster headaches.  Chronic daily headaches. HOME CARE INSTRUCTIONS   Keep all follow-up appointments with your caregiver or any specialist referral.  Only take over-the-counter or prescription medicines for pain or discomfort as directed by your caregiver.  Lie down in a dark, quiet room when you have a headache.  Keep a headache journal to find out what may trigger your migraine headaches. For example, write down:  What you eat and drink.  How much sleep you get.  Any change to your diet or medicines.  Try massage or other relaxation techniques.  Put ice packs or heat on the head and neck. Use these 3 to 4 times per day for 15 to 20 minutes each time, or as needed.  Limit stress.  Sit up straight, and do not tense your muscles.  Quit smoking if you  smoke.  Limit alcohol use.  Decrease the amount of caffeine you drink, or stop drinking caffeine.  Eat and sleep on a regular schedule.  Get 7 to 9 hours of sleep, or as recommended by your caregiver.  Keep lights dim if bright lights bother you and make your headaches worse. SEEK MEDICAL CARE IF:   You have problems with the medicines you were prescribed.  Your medicines are not working.  You have a change from the usual headache.  You have  nausea or vomiting. SEEK IMMEDIATE MEDICAL CARE IF:   Your headache becomes severe.  You have a fever.  You have a stiff neck.  You have loss of vision.  You have muscular weakness or loss of muscle control.  You start losing your balance or have trouble walking.  You feel faint or pass out.  You have severe symptoms that are different from your first symptoms. MAKE SURE YOU:   Understand these instructions.  Will watch your condition.  Will get help right away if you are not doing well or get worse. Document Released: 02/01/2005 Document Revised: 04/26/2011 Document Reviewed: 02/17/2011 [facility] Patient Information 2015 [place], [place]. This information is not intended to replace advice given to you by your health care provider. Make sure you discuss any questions you have with your health care provider.

## 2014-10-29 NOTE — MAU Note (Signed)
Pt C/O lower abd pain, HA, vomiting.  Denies bleeding.

## 2014-10-31 LAB — CULTURE, OB URINE

## 2014-11-06 ENCOUNTER — Encounter (HOSPITAL_COMMUNITY): Payer: Self-pay

## 2014-11-06 ENCOUNTER — Inpatient Hospital Stay (HOSPITAL_COMMUNITY)
Admission: AD | Admit: 2014-11-06 | Discharge: 2014-11-07 | Disposition: A | Discharge status: Home / Self Care | Payer: Medicaid Other | Source: Ambulatory Visit | Attending: Family Medicine | Admitting: Family Medicine

## 2014-11-06 DIAGNOSIS — O219 Vomiting of pregnancy, unspecified: Secondary | ICD-10-CM

## 2014-11-06 DIAGNOSIS — O99611 Diseases of the digestive system complicating pregnancy, first trimester: Secondary | ICD-10-CM | POA: Insufficient documentation

## 2014-11-06 DIAGNOSIS — O210 Mild hyperemesis gravidarum: Secondary | ICD-10-CM | POA: Diagnosis not present

## 2014-11-06 DIAGNOSIS — R1011 Right upper quadrant pain: Secondary | ICD-10-CM

## 2014-11-06 DIAGNOSIS — O9989 Other specified diseases and conditions complicating pregnancy, childbirth and the puerperium: Secondary | ICD-10-CM | POA: Diagnosis not present

## 2014-11-06 DIAGNOSIS — K219 Gastro-esophageal reflux disease without esophagitis: Secondary | ICD-10-CM | POA: Insufficient documentation

## 2014-11-06 DIAGNOSIS — R1010 Upper abdominal pain, unspecified: Secondary | ICD-10-CM | POA: Diagnosis present

## 2014-11-06 DIAGNOSIS — Z3A09 9 weeks gestation of pregnancy: Secondary | ICD-10-CM | POA: Insufficient documentation

## 2014-11-06 LAB — AMYLASE: Amylase: 51 U/L (ref 28–100)

## 2014-11-06 LAB — COMPREHENSIVE METABOLIC PANEL
ALK PHOS: 51 U/L (ref 38–126)
ALT: 81 U/L — ABNORMAL HIGH (ref 14–54)
ANION GAP: 4 — AB (ref 5–15)
AST: 64 U/L — ABNORMAL HIGH (ref 15–41)
Albumin: 3.9 g/dL (ref 3.5–5.0)
BILIRUBIN TOTAL: 0.4 mg/dL (ref 0.3–1.2)
BUN: 9 mg/dL (ref 6–20)
CALCIUM: 8.8 mg/dL — AB (ref 8.9–10.3)
CO2: 25 mmol/L (ref 22–32)
CREATININE: 0.55 mg/dL (ref 0.44–1.00)
Chloride: 105 mmol/L (ref 101–111)
GFR calc non Af Amer: >60 mL/min (ref >60)
Glucose, Bld: 114 mg/dL — ABNORMAL HIGH (ref 65–99)
Potassium: 3.3 mmol/L — ABNORMAL LOW (ref 3.5–5.1)
Sodium: 134 mmol/L — ABNORMAL LOW (ref 135–145)
TOTAL PROTEIN: 7.2 g/dL (ref 6.5–8.1)

## 2014-11-06 LAB — CBC
HEMATOCRIT: 32.6 % — AB (ref 36.0–46.0)
HEMOGLOBIN: 11.2 g/dL — AB (ref 12.0–15.0)
MCH: 29 pg (ref 26.0–34.0)
MCHC: 34.4 g/dL (ref 30.0–36.0)
MCV: 84.5 fL (ref 78.0–100.0)
Platelets: 246 10*3/uL (ref 150–400)
RBC: 3.86 MIL/uL — ABNORMAL LOW (ref 3.87–5.11)
RDW: 13.2 % (ref 11.5–15.5)
WBC: 8.1 10*3/uL (ref 4.0–10.5)

## 2014-11-06 LAB — URINALYSIS, ROUTINE W REFLEX MICROSCOPIC
Bilirubin Urine: NEGATIVE
GLUCOSE, UA: NEGATIVE mg/dL
Hgb urine dipstick: NEGATIVE
Ketones, ur: 15 mg/dL — AB
LEUKOCYTES UA: NEGATIVE
Nitrite: NEGATIVE
PH: 6 (ref 5.0–8.0)
Protein, ur: NEGATIVE mg/dL
SPECIFIC GRAVITY, URINE: 1.025 (ref 1.005–1.030)
Urobilinogen, UA: 1 mg/dL (ref 0.0–1.0)

## 2014-11-06 LAB — LIPASE, BLOOD: LIPASE: 29 U/L (ref 22–51)

## 2014-11-06 MED ORDER — FAMOTIDINE IN NACL 20-0.9 MG/50ML-% IV SOLN
20.0000 mg | Freq: Once | INTRAVENOUS | Status: AC
  Administered 2014-11-06: 20 mg via INTRAVENOUS
  Filled 2014-11-06: qty 50

## 2014-11-06 MED ORDER — LACTATED RINGERS IV SOLN
25.0000 mg | Freq: Once | INTRAVENOUS | Status: AC
  Administered 2014-11-06: 25 mg via INTRAVENOUS
  Filled 2014-11-06: qty 1

## 2014-11-06 NOTE — MAU Provider Note (Signed)
History     CSN: [id number]  Arrival date and time: 11/06/14 3500   First Provider Initiated Contact with Patient 11/06/14 2030      No chief complaint on file.  HPI Comments: Language line used for HPI   Bonnie [name] is a 29 y.o. [id number] at 107w3d who presents today with upper abdominal pain. She states that she has been here five times and the last time she was here she had medicine in her IV that helped. She does not have any more medication at this time. Patient last ate about 1730.   Abdominal Pain This is a new problem. The current episode started more than 1 month ago. The onset quality is gradual. The problem occurs intermittently. The pain is located in the epigastric region. The pain is at a severity of 8/10. The quality of the pain is sharp and burning. The abdominal pain does not radiate. Associated symptoms include headaches, nausea and vomiting. Pertinent negatives include no constipation (Last BM 9/20), diarrhea, dysuria, fever or frequency. Nothing aggravates the pain. The pain is relieved by nothing. She has tried antacids (phenergan ) for the symptoms. The treatment provided significant relief.      Past Medical History  Diagnosis Date  . Medical history non-contributory     Past Surgical History  Procedure Laterality Date  . No past surgeries      No family history on file.  Social History  Substance Use Topics  . Smoking status: Never Smoker   . Smokeless tobacco: Not on file  . Alcohol Use: No    Allergies: No Known Allergies  Prescriptions prior to admission  Medication Sig Dispense Refill Last Dose  . butalbital-acetaminophen-caffeine (FIORICET) 50-325-40 MG per tablet Take 1-2 tablets by mouth every 6 (six) hours as needed for headache. 20 tablet 0   . famotidine (PEPCID) 20 MG tablet Take 1 tablet (20 mg total) by mouth 2 (two) times daily. 30 tablet 0   . famotidine (PEPCID) 40 MG tablet Take 1 tablet (40 mg total) by mouth daily. (Patient not  taking: Reported on 10/29/2014) 30 tablet 1 Completed Course at Unknown time  . metoCLOPramide (REGLAN) 10 MG tablet Take 1 tablet (10 mg total) by mouth every 6 (six) hours. 30 tablet 0 10/29/2014 at 1100  . metoCLOPramide (REGLAN) 10 MG tablet Take 1 tablet (10 mg total) by mouth every 6 (six) hours. 30 tablet 0   . promethazine (PHENERGAN) 25 MG tablet Take 1 tablet (25 mg total) by mouth every 6 (six) hours as needed for nausea or vomiting. (Patient not taking: Reported on 10/29/2014) 30 tablet 0 Completed Course at Unknown time  . promethazine (PHENERGAN) 25 MG tablet Take 1 tablet (25 mg total) by mouth every 6 (six) hours as needed for nausea or vomiting. 30 tablet 0     Review of Systems  Constitutional: Positive for chills. Negative for fever.  Gastrointestinal: Positive for nausea, vomiting and abdominal pain. Negative for diarrhea and constipation (Last BM 9/20).  Genitourinary: Negative for dysuria, urgency and frequency.  Neurological: Positive for headaches.   Physical Exam   Blood pressure 96/61, pulse 101, temperature 98.8 F (37.1 C), temperature source Oral, resp. rate 16, height  (1.626 m), weight 62.143 kg (137 lb), last menstrual period 09/01/2014, SpO2 100 %.  Physical Exam  Nursing note and vitals reviewed. Constitutional: She is oriented to person, place, and time. She appears well-developed and well-nourished. No distress.  HENT:  Head: Normocephalic.  Cardiovascular: Normal rate.  Respiratory: Effort normal.  GI: Soft. There is tenderness (in RUQ ). There is no rebound.  Neurological: She is alert and oriented to person, place, and time.  Skin: Skin is warm and dry.  Psychiatric: She has a normal mood and affect.   Results for orders placed or performed during the hospital encounter of 11/06/14 (from the past 24 hour(s))  Urinalysis, Routine w reflex microscopic (not at [facility])     Status: Abnormal   Collection Time: 11/06/14  8:10 PM  Result Value Ref  Range   Color, Urine YELLOW YELLOW   APPearance CLEAR CLEAR   Specific Gravity, Urine 1.025 1.005 - 1.030   pH 6.0 5.0 - 8.0   Glucose, UA NEGATIVE NEGATIVE mg/dL   Hgb urine dipstick NEGATIVE NEGATIVE   Bilirubin Urine NEGATIVE NEGATIVE   Ketones, ur 15 (A) NEGATIVE mg/dL   Protein, ur NEGATIVE NEGATIVE mg/dL   Urobilinogen, UA 1.0 0.0 - 1.0 mg/dL   Nitrite NEGATIVE NEGATIVE   Leukocytes, UA NEGATIVE NEGATIVE  CBC     Status: Abnormal   Collection Time: 11/06/14  8:50 PM  Result Value Ref Range   WBC 8.1 4.0 - 10.5 K/uL   RBC 3.86 (L) 3.87 - 5.11 MIL/uL   Hemoglobin 11.2 (L) 12.0 - 15.0 g/dL   HCT 69.6 (L) 29.5 - 28.4 %   MCV 84.5 78.0 - 100.0 fL   MCH 29.0 26.0 - 34.0 pg   MCHC 34.4 30.0 - 36.0 g/dL   RDW 13.2 44.0 - 10.2 %   Platelets 246 150 - 400 K/uL  Comprehensive metabolic panel     Status: Abnormal   Collection Time: 11/06/14  8:50 PM  Result Value Ref Range   Sodium 134 (L) 135 - 145 mmol/L   Potassium 3.3 (L) 3.5 - 5.1 mmol/L   Chloride 105 101 - 111 mmol/L   CO2 25 22 - 32 mmol/L   Glucose, Bld 114 (H) 65 - 99 mg/dL   BUN 9 6 - 20 mg/dL   Creatinine, Ser 7.25 0.44 - 1.00 mg/dL   Calcium 8.8 (L) 8.9 - 10.3 mg/dL   Total Protein 7.2 6.5 - 8.1 g/dL   Albumin 3.9 3.5 - 5.0 g/dL   AST 64 (H) 15 - 41 U/L   ALT 81 (H) 14 - 54 U/L   Alkaline Phosphatase 51 38 - 126 U/L   Total Bilirubin 0.4 0.3 - 1.2 mg/dL   GFR calc non Af Amer >60 >60 mL/min   GFR calc Af Amer >60 >60 mL/min   Anion gap 4 (L) 5 - 15  Amylase     Status: None   Collection Time: 11/06/14  8:50 PM  Result Value Ref Range   Amylase 51 28 - 100 U/L  Lipase, blood     Status: None   Collection Time: 11/06/14  8:50 PM  Result Value Ref Range   Lipase 29 22 - 51 U/L   US Abdomen Complete  11/07/2014   CLINICAL DATA:  Right upper quadrant pain with increasing vomiting for least 2 weeks. Nine weeks pregnant.  EXAM: ULTRASOUND ABDOMEN COMPLETE  COMPARISON:  None.  FINDINGS: Gallbladder: No gallstones  or wall thickening visualized. No sonographic Murphy sign noted.  Common bile duct: Diameter: 2 mm, normal  Liver: No focal lesion identified. Within normal limits in parenchymal echogenicity.  IVC: No abnormality visualized.  Pancreas: Visualized portion unremarkable.  Spleen: Size and appearance within normal limits. An accessory spleen is noted.  Right Kidney: Length:  12.2 cm. Echogenicity within normal limits. No mass or hydronephrosis visualized.  Left Kidney: Length: 12.1 cm. Echogenicity within normal limits. No mass or hydronephrosis visualized.  Abdominal aorta: No aneurysm visualized.  Other findings: None.  IMPRESSION: Normal examination.   Electronically Signed   By: Burman Nieves M.D.   On: 11/07/2014 01:20    MAU Course  Procedures  MDM Patient has had 1L of LR and phenergan. She is reports feeling better, and pain has improved.    Assessment and Plan   1. RUQ abdominal pain   2. Nausea/vomiting in pregnancy   3. Gastroesophageal reflux disease, esophagitis presence not specified    DC home Comfort measures reviewed  1st Trimester precautions  Bleeding precautions RX: phenergan PRN #30, pepcid qd #30  Return to MAU as needed   Follow-up Information    Schedule an appointment as soon as possible for a visit with [facility] [place].   Contact information:   [address] [facility] [place] [address] [phone]        Tawnya Crook 11/06/2014, 8:33 PM

## 2014-11-06 NOTE — MAU Note (Signed)
Pt reports upper abd pain that continues to worsen. Has been evaluated and was given meds but finished all of it yesterday. Has been vomiting today.

## 2014-11-07 ENCOUNTER — Inpatient Hospital Stay (HOSPITAL_COMMUNITY): Payer: Medicaid Other

## 2014-11-07 DIAGNOSIS — O219 Vomiting of pregnancy, unspecified: Secondary | ICD-10-CM

## 2014-11-07 DIAGNOSIS — O9989 Other specified diseases and conditions complicating pregnancy, childbirth and the puerperium: Secondary | ICD-10-CM | POA: Diagnosis not present

## 2014-11-07 DIAGNOSIS — R1011 Right upper quadrant pain: Secondary | ICD-10-CM

## 2014-11-07 MED ORDER — PROMETHAZINE HCL 25 MG PO TABS: 12.5000 mg | ORAL_TABLET | Freq: Four times a day (QID) | ORAL | Status: DC | PRN

## 2014-11-07 MED ORDER — FAMOTIDINE 20 MG PO TABS: 20.0000 mg | ORAL_TABLET | Freq: Two times a day (BID) | ORAL | Status: DC

## 2014-11-07 NOTE — Discharge Instructions (Signed)
Morning Sickness °Morning sickness is when you feel sick to your stomach (nauseous) during pregnancy. You may feel sick to your stomach and throw up (vomit). You may feel sick in the morning, but you can feel this way any time of day. Some women feel very sick to their stomach and cannot stop throwing up (hyperemesis gravidarum). °HOME CARE °· Only take medicines as told by your doctor. °· Take multivitamins as told by your doctor. Taking multivitamins before getting pregnant can stop or lessen the harshness of morning sickness. °· Eat dry toast or unsalted crackers before getting out of bed. °· Eat 5 to 6 small meals a day. °· Eat dry and bland foods like rice and baked potatoes. °· Do not drink liquids with meals. Drink between meals. °· Do not eat greasy, fatty, or spicy foods. °· Have someone cook for you if the smell of food causes you to feel sick or throw up. °· If you feel sick to your stomach after taking prenatal vitamins, take them at night or with a snack. °· Eat protein when you need a snack (nuts, yogurt, cheese). °· Eat unsweetened gelatins for dessert. °· Wear a bracelet used for sea sickness (acupressure wristband). °· Go to a doctor that puts thin needles into certain body points (acupuncture) to improve how you feel. °· Do not smoke. °· Use a humidifier to keep the air in your house free of odors. °· Get lots of fresh air. °GET HELP IF: °· You need medicine to feel better. °· You feel dizzy or lightheaded. °· You are losing weight. °GET HELP RIGHT AWAY IF:  °· You feel very sick to your stomach and cannot stop throwing up. °· You pass out (faint). °MAKE SURE YOU: °· Understand these instructions. °· Will watch your condition. °· Will get help right away if you are not doing well or get worse. °Document Released: 03/11/2004 Document Revised: 02/06/2013 Document Reviewed: 07/19/2012 °ExitCare® Patient Information ©2015 [employer]. This information is not intended to replace advice given to you by  your health care provider. Make sure you discuss any questions you have with your health care provider. ° °

## 2014-11-08 ENCOUNTER — Inpatient Hospital Stay (HOSPITAL_COMMUNITY)
Admission: AD | Admit: 2014-11-08 | Discharge: 2014-11-08 | Disposition: A | Discharge status: Home / Self Care | Payer: Medicaid Other | Source: Ambulatory Visit | Attending: Family Medicine | Admitting: Family Medicine

## 2014-11-08 ENCOUNTER — Encounter (HOSPITAL_COMMUNITY): Payer: Self-pay

## 2014-11-08 DIAGNOSIS — O219 Vomiting of pregnancy, unspecified: Secondary | ICD-10-CM

## 2014-11-08 DIAGNOSIS — R12 Heartburn: Secondary | ICD-10-CM | POA: Diagnosis not present

## 2014-11-08 DIAGNOSIS — O210 Mild hyperemesis gravidarum: Secondary | ICD-10-CM | POA: Diagnosis not present

## 2014-11-08 DIAGNOSIS — Z3A09 9 weeks gestation of pregnancy: Secondary | ICD-10-CM | POA: Insufficient documentation

## 2014-11-08 HISTORY — DX: Vomiting, unspecified: R11.10

## 2014-11-08 LAB — URINALYSIS, ROUTINE W REFLEX MICROSCOPIC
Bilirubin Urine: NEGATIVE
GLUCOSE, UA: NEGATIVE mg/dL
Hgb urine dipstick: NEGATIVE
KETONES UR: 15 mg/dL — AB
NITRITE: NEGATIVE
PH: 6 (ref 5.0–8.0)
Protein, ur: NEGATIVE mg/dL
Urobilinogen, UA: 1 mg/dL (ref 0.0–1.0)

## 2014-11-08 LAB — COMPREHENSIVE METABOLIC PANEL
ALBUMIN: 3.9 g/dL (ref 3.5–5.0)
ALK PHOS: 50 U/L (ref 38–126)
ALT: 56 U/L — AB (ref 14–54)
AST: 25 U/L (ref 15–41)
Anion gap: 9 (ref 5–15)
BILIRUBIN TOTAL: 0.4 mg/dL (ref 0.3–1.2)
BUN: 10 mg/dL (ref 6–20)
CALCIUM: 9.1 mg/dL (ref 8.9–10.3)
CO2: 21 mmol/L — ABNORMAL LOW (ref 22–32)
CREATININE: 0.41 mg/dL — AB (ref 0.44–1.00)
Chloride: 103 mmol/L (ref 101–111)
GFR calc Af Amer: >60 mL/min (ref >60)
GLUCOSE: 85 mg/dL (ref 65–99)
Potassium: 3.6 mmol/L (ref 3.5–5.1)
Sodium: 133 mmol/L — ABNORMAL LOW (ref 135–145)
TOTAL PROTEIN: 7.4 g/dL (ref 6.5–8.1)

## 2014-11-08 LAB — CBC
HEMATOCRIT: 34.3 % — AB (ref 36.0–46.0)
HEMOGLOBIN: 11.9 g/dL — AB (ref 12.0–15.0)
MCH: 28.9 pg (ref 26.0–34.0)
MCHC: 34.7 g/dL (ref 30.0–36.0)
MCV: 83.3 fL (ref 78.0–100.0)
Platelets: 255 10*3/uL (ref 150–400)
RBC: 4.12 MIL/uL (ref 3.87–5.11)
RDW: 13.4 % (ref 11.5–15.5)
WBC: 8.7 10*3/uL (ref 4.0–10.5)

## 2014-11-08 LAB — URINE MICROSCOPIC-ADD ON

## 2014-11-08 MED ORDER — PROMETHAZINE HCL 25 MG RE SUPP: 25.0000 mg | Freq: Four times a day (QID) | RECTAL | Status: DC | PRN

## 2014-11-08 MED ORDER — DEXTROSE IN LACTATED RINGERS 5 % IV SOLN
25.0000 mg | Freq: Once | INTRAVENOUS | Status: AC
  Administered 2014-11-08: 25 mg via INTRAVENOUS
  Filled 2014-11-08: qty 1

## 2014-11-08 MED ORDER — FAMOTIDINE IN NACL 20-0.9 MG/50ML-% IV SOLN
20.0000 mg | Freq: Once | INTRAVENOUS | Status: AC
  Administered 2014-11-08: 20 mg via INTRAVENOUS
  Filled 2014-11-08: qty 50

## 2014-11-08 MED ORDER — ACETAMINOPHEN 500 MG PO TABS
1000.0000 mg | ORAL_TABLET | Freq: Once | ORAL | Status: AC
  Administered 2014-11-08: 1000 mg via ORAL
  Filled 2014-11-08: qty 2

## 2014-11-08 NOTE — MAU Note (Signed)
Per [facility] interpreter [place], pt's nausea relieved, however still has headache rated 8/10.

## 2014-11-08 NOTE — MAU Provider Note (Signed)
History     CSN: [id number]  Arrival date and time: 11/08/14 1312   None     Chief Complaint  Patient presents with  . Abdominal Pain  . Headache  . Dizziness   HPI Ms. Bonnie [name] is a 29 y.o. [id number] at [redacted]w[redacted]d who presents to MAU today with complaint of nausea and vomiting. She states that this has been present throughout the pregnancy. She has been prescribed Phenergan and Reglan which she has tried without relief. She states last dose of Phenergan at 1100 today. She has not been taking Reglan or Pepcid consistently. She also endorses associated upper abdominal pain in the epigastric area. She denies vaginal bleeding or UTI symptoms. She rates her pain at 8/10 now. She does endorse heartburn and denies fever.   OB History    Gravida Para Term Preterm AB TAB SAB Ectopic Multiple Living   Past Medical History  Diagnosis Date  . Hyperemesis     Past Surgical History  Procedure Laterality Date  . No past surgeries      History reviewed. No pertinent family history.  Social History  Substance Use Topics  . Smoking status: Never Smoker   . Smokeless tobacco: Never Used  . Alcohol Use: No    Allergies: No Known Allergies  Prescriptions prior to admission  Medication Sig Dispense Refill Last Dose  . famotidine (PEPCID) 20 MG tablet Take 1 tablet (20 mg total) by mouth 2 (two) times daily. 30 tablet 0 11/08/2014 at Unknown time  . promethazine (PHENERGAN) 25 MG tablet Take 0.5-1 tablets (12.5-25 mg total) by mouth every 6 (six) hours as needed. 30 tablet 0 11/08/2014 at Unknown time  . butalbital-acetaminophen-caffeine (FIORICET) 50-325-40 MG per tablet Take 1-2 tablets by mouth every 6 (six) hours as needed for headache. (Patient not taking: Reported on 11/08/2014) 20 tablet 0 Not Taking at Unknown time  . metoCLOPramide (REGLAN) 10 MG tablet Take 1 tablet (10 mg total) by mouth every 6 (six) hours. (Patient not taking: Reported on 11/08/2014) 30 tablet 0  Not Taking at Unknown time    Review of Systems  Constitutional: Negative for fever and malaise/fatigue.  Gastrointestinal: Positive for heartburn, nausea, vomiting and abdominal pain. Negative for diarrhea and constipation.  Genitourinary: Negative for dysuria, urgency and frequency.       Neg - vaginal bleeding   Physical Exam   Blood pressure 102/60, pulse 105, temperature 98.6 F (37 C), temperature source Oral, resp. rate 20, height  (1.626 m), weight 135 lb 6.4 oz (61.417 kg), last menstrual period 09/01/2014.  Physical Exam  Nursing note and vitals reviewed. Constitutional: She is oriented to person, place, and time. She appears well-developed and well-nourished. No distress.  HENT:  Head: Normocephalic and atraumatic.  Cardiovascular: Normal rate.   Respiratory: Effort normal.  GI: Soft. Bowel sounds are normal. She exhibits no distension and no mass. There is tenderness (mild diffuse tenderness to palpation most prominent in the midline epigastric region). There is no rebound and no guarding.  Neurological: She is alert and oriented to person, place, and time.  Skin: Skin is warm and dry. No erythema.  Psychiatric: She has a normal mood and affect.    Results for orders placed or performed during the hospital encounter of 11/08/14 (from the past 24 hour(s))  Urinalysis, Routine w reflex microscopic (not at [facility])     Status: Abnormal   Collection  Time: 11/08/14  1:35 PM  Result Value Ref Range   Color, Urine YELLOW YELLOW   APPearance CLEAR CLEAR   Specific Gravity, Urine >1.030 (H) 1.005 - 1.030   pH 6.0 5.0 - 8.0   Glucose, UA NEGATIVE NEGATIVE mg/dL   Hgb urine dipstick NEGATIVE NEGATIVE   Bilirubin Urine NEGATIVE NEGATIVE   Ketones, ur 15 (A) NEGATIVE mg/dL   Protein, ur NEGATIVE NEGATIVE mg/dL   Urobilinogen, UA 1.0 0.0 - 1.0 mg/dL   Nitrite NEGATIVE NEGATIVE   Leukocytes, UA TRACE (A) NEGATIVE  Urine microscopic-add on     Status: Abnormal   Collection  Time: 11/08/14  1:35 PM  Result Value Ref Range   Squamous Epithelial / LPF FEW (A) RARE   WBC, UA 0-2 <3 WBC/hpf  CBC     Status: Abnormal   Collection Time: 11/08/14  2:52 PM  Result Value Ref Range   WBC 8.7 4.0 - 10.5 K/uL   RBC 4.12 3.87 - 5.11 MIL/uL   Hemoglobin 11.9 (L) 12.0 - 15.0 g/dL   HCT 14.7 (L) 82.9 - 56.2 %   MCV 83.3 78.0 - 100.0 fL   MCH 28.9 26.0 - 34.0 pg   MCHC 34.7 30.0 - 36.0 g/dL   RDW 13.0 86.5 - 78.4 %   Platelets 255 150 - 400 K/uL  Comprehensive metabolic panel     Status: Abnormal   Collection Time: 11/08/14  2:52 PM  Result Value Ref Range   Sodium 133 (L) 135 - 145 mmol/L   Potassium 3.6 3.5 - 5.1 mmol/L   Chloride 103 101 - 111 mmol/L   CO2 21 (L) 22 - 32 mmol/L   Glucose, Bld 85 65 - 99 mg/dL   BUN 10 6 - 20 mg/dL   Creatinine, Ser 6.96 (L) 0.44 - 1.00 mg/dL   Calcium 9.1 8.9 - 29.5 mg/dL   Total Protein 7.4 6.5 - 8.1 g/dL   Albumin 3.9 3.5 - 5.0 g/dL   AST 25 15 - 41 U/L   ALT 56 (H) 14 - 54 U/L   Alkaline Phosphatase 50 38 - 126 U/L   Total Bilirubin 0.4 0.3 - 1.2 mg/dL   GFR calc non Af Amer >60 >60 mL/min   GFR calc Af Amer >60 >60 mL/min   Anion gap 9 5 - 15    MAU Course  Procedures None  MDM UA shows significant dehydration IV LR with Phenergan and Pepcid IV given in MAU CBC and CMP today Patient states improvement in N/V and abdominal pain. Now has complaint of headache. Pain rated at 8/10 1000 mg Tylenol given. Patient reports some improvement in headache.  Assessment and Plan  A: SIUP at [redacted]w[redacted]d Nausea and vomiting in pregnancy prior to [redacted] weeks gestation Heartburn in pregnancy  P: Discharge home Rx for Phenergan suppositories given to patient Patient advised to take previously prescribed medications for N/V and heartburn scheduled until first appointment with OB on 11/11/14 Diet for N/V in pregnancy discussed Patient advised to follow-up with OB provider of choice as planned to start prenatal care on 11/11/14 Patient  may return to MAU as needed or if her condition were to change or worsen   Marny Lowenstein, PA-C  11/08/2014, 6:10 PM

## 2014-11-08 NOTE — MAU Note (Signed)
Per [facility] interpreter (Arabic) #     , pt here for headache and stomach pain x1 month. Every 4-5 days pt comes here, states medication is not helping. Did take medicine today however vomited it up. Is constipated. No bleeding, does have vaginal discharge with odor noted since beginning of pregnancy. Last ate at 1100, then vomited w medication.

## 2014-11-08 NOTE — MAU Note (Signed)
Reviewing discharge instructions and medications via [facility] interpreter.

## 2014-11-08 NOTE — Discharge Instructions (Signed)
Morning Sickness °Morning sickness is when you feel sick to your stomach (nauseous) during pregnancy. You may feel sick to your stomach and throw up (vomit). You may feel sick in the morning, but you can feel this way any time of day. Some women feel very sick to their stomach and cannot stop throwing up (hyperemesis gravidarum). °HOME CARE °· Only take medicines as told by your doctor. °· Take multivitamins as told by your doctor. Taking multivitamins before getting pregnant can stop or lessen the harshness of morning sickness. °· Eat dry toast or unsalted crackers before getting out of bed. °· Eat 5 to 6 small meals a day. °· Eat dry and bland foods like rice and baked potatoes. °· Do not drink liquids with meals. Drink between meals. °· Do not eat greasy, fatty, or spicy foods. °· Have someone cook for you if the smell of food causes you to feel sick or throw up. °· If you feel sick to your stomach after taking prenatal vitamins, take them at night or with a snack. °· Eat protein when you need a snack (nuts, yogurt, cheese). °· Eat unsweetened gelatins for dessert. °· Wear a bracelet used for sea sickness (acupressure wristband). °· Go to a doctor that puts thin needles into certain body points (acupuncture) to improve how you feel. °· Do not smoke. °· Use a humidifier to keep the air in your house free of odors. °· Get lots of fresh air. °GET HELP IF: °· You need medicine to feel better. °· You feel dizzy or lightheaded. °· You are losing weight. °GET HELP RIGHT AWAY IF:  °· You feel very sick to your stomach and cannot stop throwing up. °· You pass out (faint). °MAKE SURE YOU: °· Understand these instructions. °· Will watch your condition. °· Will get help right away if you are not doing well or get worse. °Document Released: 03/11/2004 Document Revised: 02/06/2013 Document Reviewed: 07/19/2012 °[employer]® Patient Information ©2015 [employer]. This information is not intended to replace advice given to you by  your health care provider. Make sure you discuss any questions you have with your health care provider. ° °Eating Plan for Hyperemesis Gravidarum °Severe cases of hyperemesis gravidarum can lead to dehydration and malnutrition. The hyperemesis eating plan is one way to lessen the symptoms of nausea and vomiting. It is often used with prescribed medicines to control your symptoms.  °WHAT CAN I DO TO RELIEVE MY SYMPTOMS? °Listen to your body. Everyone is different and has different preferences. Find what works best for you. Some of the following things may help: °· Eat and drink slowly. °· Eat 5-6 small meals daily instead of 3 large meals.   °· Eat crackers before you get out of bed in the morning.   °· Starchy foods are usually well tolerated (such as cereal, toast, bread, potatoes, pasta, rice, and pretzels).   °· Ginger may help with nausea. Add ¼ tsp ground ginger to hot tea or choose ginger tea.   °· Try drinking 100% fruit juice or an electrolyte drink. °· Continue to take your prenatal vitamins as directed by your health care provider. If you are having trouble taking your prenatal vitamins, talk with your health care provider about different options. °· Include at least 1 serving of protein with your meals and snacks (such as meats or poultry, beans, nuts, eggs, or yogurt). Try eating a protein-rich snack before bed (such as cheese and crackers or a half turkey or peanut butter sandwich). °WHAT THINGS SHOULD I   AVOID TO REDUCE MY SYMPTOMS? °The following things may help reduce your symptoms: °· Avoid foods with strong smells. Try eating meals in well-ventilated areas that are free of odors. °· Avoid drinking water or other beverages with meals. Try not to drink anything less than 30 minutes before and after meals. °· Avoid drinking more than 1 cup of fluid at a time. °· Avoid fried or high-fat foods, such as butter and cream sauces. °· Avoid spicy foods. °· Avoid skipping meals the best you can. Nausea can be  more intense on an empty stomach. If you cannot tolerate food at that time, do not force it. Try sucking on ice chips or other frozen items and make up the calories later. °· Avoid lying down within 2 hours after eating. °Document Released: 11/29/2006 Document Revised: 02/06/2013 Document Reviewed: 12/06/2012 °[employer]® Patient Information ©2015 [employer]. This information is not intended to replace advice given to you by your health care provider. Make sure you discuss any questions you have with your health care provider. ° °

## 2014-11-08 NOTE — MAU Note (Signed)
Stomach ache, headache, dizziness.  Has been an ongoing problem. (pt is rocking in chair).  Worse the last 3 days.  Whatever she eats, she throws up, medication is not help.

## 2014-11-12 ENCOUNTER — Encounter (HOSPITAL_COMMUNITY): Payer: Self-pay | Admitting: *Deleted

## 2014-11-12 ENCOUNTER — Inpatient Hospital Stay (HOSPITAL_COMMUNITY)
Admission: AD | Admit: 2014-11-12 | Discharge: 2014-11-12 | Disposition: A | Discharge status: Home / Self Care | Payer: Medicaid Other | Source: Ambulatory Visit | Attending: Obstetrics and Gynecology | Admitting: Obstetrics and Gynecology

## 2014-11-12 DIAGNOSIS — R51 Headache: Secondary | ICD-10-CM | POA: Diagnosis not present

## 2014-11-12 DIAGNOSIS — Z3A10 10 weeks gestation of pregnancy: Secondary | ICD-10-CM | POA: Insufficient documentation

## 2014-11-12 DIAGNOSIS — O210 Mild hyperemesis gravidarum: Secondary | ICD-10-CM | POA: Insufficient documentation

## 2014-11-12 DIAGNOSIS — O2341 Unspecified infection of urinary tract in pregnancy, first trimester: Secondary | ICD-10-CM | POA: Diagnosis not present

## 2014-11-12 DIAGNOSIS — O9989 Other specified diseases and conditions complicating pregnancy, childbirth and the puerperium: Secondary | ICD-10-CM

## 2014-11-12 DIAGNOSIS — R109 Unspecified abdominal pain: Secondary | ICD-10-CM

## 2014-11-12 DIAGNOSIS — O219 Vomiting of pregnancy, unspecified: Secondary | ICD-10-CM

## 2014-11-12 DIAGNOSIS — O26899 Other specified pregnancy related conditions, unspecified trimester: Secondary | ICD-10-CM

## 2014-11-12 LAB — URINALYSIS, ROUTINE W REFLEX MICROSCOPIC
Glucose, UA: NEGATIVE mg/dL
KETONES UR: NEGATIVE mg/dL
Nitrite: POSITIVE — AB
Protein, ur: NEGATIVE mg/dL
UROBILINOGEN UA: 1 mg/dL (ref 0.0–1.0)
pH: 5.5 (ref 5.0–8.0)

## 2014-11-12 LAB — URINE MICROSCOPIC-ADD ON

## 2014-11-12 MED ORDER — SODIUM CHLORIDE 0.9 % IV BOLUS (SEPSIS)
1000.0000 mL | Freq: Once | INTRAVENOUS | Status: AC
  Administered 2014-11-12: 1000 mL via INTRAVENOUS

## 2014-11-12 MED ORDER — METOCLOPRAMIDE HCL 5 MG/ML IJ SOLN
10.0000 mg | Freq: Once | INTRAMUSCULAR | Status: AC
  Administered 2014-11-12: 10 mg via INTRAVENOUS
  Filled 2014-11-12: qty 2

## 2014-11-12 MED ORDER — CEPHALEXIN 500 MG PO CAPS: 500.0000 mg | ORAL_CAPSULE | Freq: Four times a day (QID) | ORAL | Status: DC

## 2014-11-12 MED ORDER — FAMOTIDINE IN NACL 20-0.9 MG/50ML-% IV SOLN
20.0000 mg | Freq: Once | INTRAVENOUS | Status: AC
  Administered 2014-11-12: 20 mg via INTRAVENOUS
  Filled 2014-11-12: qty 50

## 2014-11-12 MED ORDER — DEXTROSE 5 % IV SOLN
2.0000 g | Freq: Once | INTRAVENOUS | Status: AC
  Administered 2014-11-12: 2 g via INTRAVENOUS
  Filled 2014-11-12: qty 2

## 2014-11-12 NOTE — MAU Note (Signed)
Urine in lab 

## 2014-11-12 NOTE — MAU Note (Addendum)
States she has a headache, and stomach pain. States she has been taking medication at home and it is not helping. When asked what it did not help with, she answered, "the pain." States is concerned because she received some vaccines when she first arrived and did not know she was pregnant. States she did not have these issues with previous pregnancies. States her primary reason for coming to MAU today is stomach pain and headache. Points to upper abdomen and states it is continuous, even through the night. States it is effecting her life and she cannot take care of her children. States if she was offered an abortion right now, she would take it.

## 2014-11-12 NOTE — MAU Note (Signed)
Also C/O burning with urination.

## 2014-11-12 NOTE — MAU Provider Note (Signed)
History     CSN: [id number]  Arrival date and time: 11/12/14 1341   None     Chief Complaint  Patient presents with  . Headache  . Abdominal Pain   HPI Pt is 29 yo [id number] at [redacted]w[redacted]d pregnant and presents with lower abd pain and upper abdominal pain. New problem:Pt states she has had pain with urination for about 4 days. Pt also has a headache which she has been taking medication(uprescription fioricet) for but doesn't help. Pt continues to have nausea and vomiting which the medication has helped- but does not help with the stomach pain. Pt had Ed visit on 10/13/2014 and MAU visits on 8/29, 9/1, 9/7/, 9/13/9/23 and today for nausea and vomiting and upper abd/heartburn in pregnancy.  She has had US OB complete with confirmed SLIUP and US abdomen 11/07/2014 which was normal.  She has been taking phenergan and pepcid Pt has a [facility] appointment 10/26 for new OB appointment Pt denies spotting or bleeding  RN note: States she has a headache, and stomach pain. States she has been taking medication at home and it is not helping. When asked what it did not help with, she answered, "the pain." States is concerned because she received some vaccines when she first arrived and did not know she was pregnant. States she did not have these issues with previous pregnancies. States her primary reason for coming to MAU today is stomach pain and headache. Points to upper abdomen and states it is continuous, even through the night. States it is effecting her life and she cannot take care of her children. States if she was offered an abortion right now, she would take it.         Past Medical History  Diagnosis Date  . Hyperemesis     Past Surgical History  Procedure Laterality Date  . No past surgeries      History reviewed. No pertinent family history.  Social History  Substance Use Topics  . Smoking status: Never Smoker   . Smokeless tobacco: Never Used  . Alcohol Use: No    Allergies: No  Known Allergies  Prescriptions prior to admission  Medication Sig Dispense Refill Last Dose  . butalbital-acetaminophen-caffeine (FIORICET) 50-325-40 MG per tablet Take 1-2 tablets by mouth every 6 (six) hours as needed for headache. (Patient not taking: Reported on 11/08/2014) 20 tablet 0 Not Taking at Unknown time  . famotidine (PEPCID) 20 MG tablet Take 1 tablet (20 mg total) by mouth 2 (two) times daily. 30 tablet 0 11/08/2014 at Unknown time  . metoCLOPramide (REGLAN) 10 MG tablet Take 1 tablet (10 mg total) by mouth every 6 (six) hours. (Patient not taking: Reported on 11/08/2014) 30 tablet 0 Not Taking at Unknown time  . promethazine (PHENERGAN) 25 MG suppository Place 1 suppository (25 mg total) rectally every 6 (six) hours as needed for nausea or vomiting. 12 each 0   . promethazine (PHENERGAN) 25 MG tablet Take 0.5-1 tablets (12.5-25 mg total) by mouth every 6 (six) hours as needed. 30 tablet 0 11/08/2014 at Unknown time    Review of Systems  Constitutional: Negative for fever and chills.  Gastrointestinal: Positive for heartburn, nausea, vomiting and abdominal pain. Negative for diarrhea and constipation.  Genitourinary: Positive for dysuria.  Neurological: Positive for headaches.   Physical Exam   Blood pressure 93/66, pulse 84, temperature 98.4 F (36.9 C), temperature source Oral, resp. rate 16, height  (1.626 m), weight 134 lb 9.6 oz (61.054 kg), last  menstrual period 09/01/2014, SpO2 100 %.  Physical Exam  Nursing note and vitals reviewed. Constitutional: She is oriented to person, place, and time. She appears well-developed and well-nourished. No distress.  HENT:  Head: Normocephalic.  Eyes: Pupils are equal, round, and reactive to light.  Neck: Normal range of motion. Neck supple.  Cardiovascular: Normal rate.   Respiratory: Effort normal.  GI: Soft.  Musculoskeletal: Normal range of motion.  Neurological: She is alert and oriented to person, place, and time.   Skin: Skin is warm. There is pallor.  Psychiatric: She has a normal mood and affect.    MAU Course  Procedures Results for orders placed or performed during the hospital encounter of 11/12/14 (from the past 24 hour(s))  Urinalysis, Routine w reflex microscopic (not at [facility])     Status: Abnormal   Collection Time: 11/12/14  1:35 PM  Result Value Ref Range   Color, Urine YELLOW YELLOW   APPearance HAZY (A) CLEAR   Specific Gravity, Urine >1.030 (H) 1.005 - 1.030   pH 5.5 5.0 - 8.0   Glucose, UA NEGATIVE NEGATIVE mg/dL   Hgb urine dipstick TRACE (A) NEGATIVE   Bilirubin Urine SMALL (A) NEGATIVE   Ketones, ur NEGATIVE NEGATIVE mg/dL   Protein, ur NEGATIVE NEGATIVE mg/dL   Urobilinogen, UA 1.0 0.0 - 1.0 mg/dL   Nitrite POSITIVE (A) NEGATIVE   Leukocytes, UA SMALL (A) NEGATIVE  Urine microscopic-add on     Status: Abnormal   Collection Time: 11/12/14  1:35 PM  Result Value Ref Range   Squamous Epithelial / LPF RARE RARE   WBC, UA 0-2 <3 WBC/hpf   RBC / HPF 3-6 <3 RBC/hpf   Bacteria, UA MANY (A) RARE  urine culture pending IV NS infused- after 300cc, pt states her headache has gone and she feels better- her abdominal pain is better Rocephin 2gm IV given x 1 in MAU Reglan and Pepcid IV  Assessment and Plan  Nausea and vomiting in pregnancy Headache in pregnancy UTI in pregnancy-Rx Keflex  QID Return for worsening sx, fever or chills  LINEBERRY,SUSAN 11/12/2014, 4:34 PM

## 2014-11-14 LAB — CULTURE, OB URINE
Culture: >=100000
SPECIAL REQUESTS: NORMAL

## 2014-11-27 ENCOUNTER — Ambulatory Visit: Payer: Self-pay

## 2014-12-04 ENCOUNTER — Other Ambulatory Visit: Payer: Self-pay | Admitting: Advanced Practice Midwife

## 2014-12-16 LAB — OB RESULTS CONSOLE RUBELLA ANTIBODY, IGM: RUBELLA: IMMUNE

## 2014-12-16 LAB — OB RESULTS CONSOLE ANTIBODY SCREEN: ANTIBODY SCREEN: NEGATIVE

## 2014-12-16 LAB — OB RESULTS CONSOLE HEPATITIS B SURFACE ANTIGEN: HEP B S AG: NEGATIVE

## 2014-12-16 LAB — OB RESULTS CONSOLE GC/CHLAMYDIA
Chlamydia: NEGATIVE
GC PROBE AMP, GENITAL: NEGATIVE

## 2014-12-16 LAB — OB RESULTS CONSOLE RPR: RPR: NONREACTIVE

## 2014-12-16 LAB — OB RESULTS CONSOLE HIV ANTIBODY (ROUTINE TESTING): HIV: NONREACTIVE

## 2014-12-16 LAB — OB RESULTS CONSOLE HGB/HCT, BLOOD
HCT: 31 %
HEMOGLOBIN: 10.9 g/dL

## 2014-12-16 LAB — OB RESULTS CONSOLE ABO/RH: RH Type: POSITIVE

## 2014-12-16 LAB — OB RESULTS CONSOLE PLATELET COUNT: PLATELETS: 228 10*3/uL

## 2014-12-18 ENCOUNTER — Ambulatory Visit: Payer: Self-pay

## 2014-12-25 ENCOUNTER — Ambulatory Visit (HOSPITAL_COMMUNITY): Payer: Medicaid Other

## 2014-12-27 ENCOUNTER — Encounter (HOSPITAL_COMMUNITY): Payer: Medicaid Other

## 2015-01-13 LAB — OB RESULTS CONSOLE HGB/HCT, BLOOD
HEMATOCRIT: 32 %
Hemoglobin: 10.6 g/dL

## 2015-01-13 LAB — OB RESULTS CONSOLE GC/CHLAMYDIA
CHLAMYDIA, DNA PROBE: NEGATIVE
Gonorrhea: NEGATIVE

## 2015-01-13 LAB — OB RESULTS CONSOLE HIV ANTIBODY (ROUTINE TESTING): HIV: NONREACTIVE

## 2015-01-22 ENCOUNTER — Ambulatory Visit: Payer: Medicaid Other

## 2015-02-16 NOTE — L&D Delivery Note (Signed)
Delivery Note A [id number] female at 3377w3d presented on 4/5 with frequent, painful contractions and was found to be 5.5 cm dilated. Hx significant for GDM treated with glyburide. She progressed spontaneously to 7cm but had arrest of progress at 7 cm x 2-3 hours. At 4:15PM, her membranes were artificially ruptured. Her contractions subsequently became stronger.   At 4:59 PM a viable female was delivered via Vaginal, Spontaneous Delivery (Presentation: Vertex). Delivery was complicated by loose nuchal cord x 1 but difficult to reduce so infant was delivered then cord reduced without complication. APGAR: 8, 9; weight pending.   Placenta status: Intact, Spontaneous.  Cord: 3 vessels with the following complications: None.    Anesthesia: None  Episiotomy: None Lacerations:  1st degree perineal laceration that was hemostatic and not repaired. Left and right labial lacerations that were hemostatic and not repair. Suture Repair: n/a Est. Blood Loss (mL):  200  Mom to postpartum.  Baby to Couplet care / Skin to Skin.  Jinny BlossomKaty D Mayo 05/21/2015, 5:15 PM  I was present for this delivery and agree with the above resident's note.  LEFTWICH-KIRBY, Daralyn Bert Certified Nurse-Midwife

## 2015-03-05 ENCOUNTER — Ambulatory Visit (INDEPENDENT_AMBULATORY_CARE_PROVIDER_SITE_OTHER): Payer: Medicaid Other | Admitting: Family Medicine

## 2015-03-05 VITALS — BP 99/63 | HR 98 | Temp 98.5°F | Ht 64.0 in | Wt 154.5 lb

## 2015-03-05 DIAGNOSIS — O9989 Other specified diseases and conditions complicating pregnancy, childbirth and the puerperium: Secondary | ICD-10-CM

## 2015-03-05 DIAGNOSIS — Z0289 Encounter for other administrative examinations: Secondary | ICD-10-CM | POA: Insufficient documentation

## 2015-03-05 DIAGNOSIS — Z789 Other specified health status: Secondary | ICD-10-CM | POA: Diagnosis not present

## 2015-03-05 DIAGNOSIS — Z008 Encounter for other general examination: Secondary | ICD-10-CM

## 2015-03-05 DIAGNOSIS — O26899 Other specified pregnancy related conditions, unspecified trimester: Secondary | ICD-10-CM | POA: Insufficient documentation

## 2015-03-05 DIAGNOSIS — R109 Unspecified abdominal pain: Secondary | ICD-10-CM | POA: Diagnosis not present

## 2015-03-05 NOTE — Assessment & Plan Note (Signed)
Likely round ligament or other musculoskeletal etiology. Recommended tylenol as needed for pain. Will follow up with health department for ongoing prenatal care, though patient is considering transferring care to this office.

## 2015-03-05 NOTE — Assessment & Plan Note (Signed)
Has been seen at health department. Awaiting records. Will see again in office after delivery or sooner if patient decided to have her prenatal care transferred here.

## 2015-03-05 NOTE — Progress Notes (Signed)
Arabic interpreter Ghaehanser#[id number] utilized during today's visit.  [facility] Visit  HPI:  Patient presents to [facility] today for a new patient appointment to establish general primary care, also to discuss abdominal pain.  Abdominal Pain Patient is currently [redacted] weeks pregnant. Abdominal pain started with pregnancy. Described as a heaviness. Worse with sleeping and eating. Not affected by movement. Some nausea when hungry. No vomiting. No constipation or diarrhea. Goes to the health department for her prenatal care. Has not tried any medications for abdominal pain.  ROS: See HPI  Past Medical Hx:  -None  Past Surgical Hx:  -2010 induced abortion  Family Hx: updated in Epic - Number of family members:  5 - Number of family members in [place]:  5  Lives with husband and children  Immigrant Social History: - Name spelling correct?: Yes - Date arrived in [place]: July 23, 2014 - Country of origin: [place] - Location of refugee camp (if applicable), how long there, and what caused patient to leave home country?: Stayed in [place] for 15 days - Primary language: Arabic  -Requires intepreter (essentially speaks no [place]) - Education: Highest level of education: [employer] - Prior work: None - Best family contact/phone number [phone] - Tobacco/alcohol/drug use: No - Marriage Status: Married - Sexual activity: With husband - Class B conditions: None - Were you beaten or tortured in your country or refugee camp?  No  Preventative Care History: -Seen at health department?: Yes  PHYSICAL EXAM: BP 99/63 mmHg  Pulse 98  Temp(Src) 98.5 F (36.9 C) (Oral)  Ht  (1.626 m)  Wt 154 lb 8 oz (70.081 kg)  BMI 26.51 kg/m2  LMP 09/01/2014 Physical exam deferred by patient.   Examined and interviewed with Dr. Gwendolyn Grant

## 2015-03-24 ENCOUNTER — Ambulatory Visit: Payer: Medicaid Other | Admitting: *Deleted

## 2015-03-27 LAB — OB RESULTS CONSOLE HGB/HCT, BLOOD
HCT: 32 %
HEMOGLOBIN: 10.4 g/dL

## 2015-04-18 ENCOUNTER — Encounter: Payer: Self-pay | Admitting: *Deleted

## 2015-04-18 DIAGNOSIS — O0990 Supervision of high risk pregnancy, unspecified, unspecified trimester: Secondary | ICD-10-CM | POA: Insufficient documentation

## 2015-04-18 DIAGNOSIS — O24419 Gestational diabetes mellitus in pregnancy, unspecified control: Secondary | ICD-10-CM

## 2015-04-21 ENCOUNTER — Ambulatory Visit: Payer: Medicaid Other | Admitting: *Deleted

## 2015-04-21 ENCOUNTER — Ambulatory Visit (INDEPENDENT_AMBULATORY_CARE_PROVIDER_SITE_OTHER): Payer: Medicaid Other | Admitting: Family Medicine

## 2015-04-21 ENCOUNTER — Encounter: Payer: Self-pay | Admitting: Family Medicine

## 2015-04-21 VITALS — BP 109/65 | HR 87 | Temp 98.5°F | Wt 161.6 lb

## 2015-04-21 DIAGNOSIS — O24419 Gestational diabetes mellitus in pregnancy, unspecified control: Secondary | ICD-10-CM

## 2015-04-21 DIAGNOSIS — O0993 Supervision of high risk pregnancy, unspecified, third trimester: Secondary | ICD-10-CM

## 2015-04-21 LAB — POCT URINALYSIS DIP (DEVICE)
BILIRUBIN URINE: NEGATIVE
Glucose, UA: NEGATIVE mg/dL
Hgb urine dipstick: NEGATIVE
KETONES UR: NEGATIVE mg/dL
Nitrite: NEGATIVE
Protein, ur: NEGATIVE mg/dL
SPECIFIC GRAVITY, URINE: 1.02 (ref 1.005–1.030)
Urobilinogen, UA: 1 mg/dL (ref 0.0–1.0)
pH: 7 (ref 5.0–8.0)

## 2015-04-21 MED ORDER — ACCU-CHEK FASTCLIX LANCETS MISC: 1.0000 [IU] | Freq: Four times a day (QID) | Status: DC

## 2015-04-21 MED ORDER — ACCU-CHEK NANO SMARTVIEW W/DEVICE KIT: 1.0000 [IU] | PACK | Status: DC

## 2015-04-21 MED ORDER — GLUCOSE BLOOD VI STRP: ORAL_STRIP | Status: DC

## 2015-04-21 NOTE — Progress Notes (Signed)
  Patient was seen on 04/21/15 for Gestational Diabetes self-management . Assist of [employer] Interpreter The following learning objectives were met by the patient :   States the definition of Gestational Diabetes  States why dietary management is important in controlling blood glucose  Describes the effects of carbohydrates on blood glucose levels  Demonstrates ability to create a balanced meal plan  Demonstrates carbohydrate counting   States when to check blood glucose levels  Demonstrates proper blood glucose monitoring techniques  States the effect of stress and exercise on blood glucose levels  States the importance of limiting caffeine and abstaining from alcohol and smoking  Plan:  Consider  increasing your activity level by walking daily as tolerated Begin checking BG before breakfast and 2 hours after first bit of breakfast, lunch and dinner after  as directed by MD  Take medication  as directed by MD  Blood glucose monitor given: Bonnie [name] Lot # [id number] Exp: 2015/12/16  Patient instructed to monitor glucose levels: FBS: 60 - <90 2 hour: <120  Patient received the following handouts:  Nutrition Diabetes and Pregnancy  Carbohydrate Counting List  Meal Planning worksheet  Patient will be seen for follow-up as needed.

## 2015-04-21 NOTE — Progress Notes (Signed)
Nutrition Note: GDM Diet Education Pt with new diagnosis of GDM.  Pt has gained 17.4 # @ 1154w1d, which is < expected.  Pt reports eating 2 meals and 2 snacks daily.  Pt is taking PNV. Pt reports no N & V. NKFA. Pt received verbal and written education on GDM diet during pregnancy. Pt agrees to continue to take PNV.  Pt has WIC and plans to BF.  F/u in 4 weeks. Carloyn Mannerebekah Jeanette Moffatt, MS, RD, LDN

## 2015-04-21 NOTE — Progress Notes (Signed)
Subjective:  Bonnie [name] is a 30 y.o. [id number] at 7851w1d being seen today for transferring prenatal care.  She is currently monitored for the following issues for this high-risk pregnancy and has Language barrier; Supervision of high-risk pregnancy; and Gestational diabetes mellitus (GDM), antepartum on her problem list.  Patient reports no complaints.  Contractions: Not present.  .  Movement: Present. Denies leaking of fluid.   The following portions of the patient's history were reviewed and updated as appropriate: allergies, current medications, past family history, past medical history, past social history, past surgical history and problem list. Problem list updated.  Objective:   Filed Vitals:   04/21/15 0840  BP: 109/65  Pulse: 87  Temp: 98.5 F (36.9 C)  Weight: 161 lb 9.6 oz (73.301 kg)    Fetal Status: Fetal Heart Rate (bpm): 141 Fundal Height: 33 cm Movement: Present     General:  Alert, oriented and cooperative. Patient is in no acute distress.  Skin: Skin is warm and dry. No rash noted.   Cardiovascular: Normal heart rate noted  Respiratory: Normal respiratory effort, no problems with respiration noted  Abdomen: Soft, gravid, appropriate for gestational age. Pain/Pressure: Absent     Pelvic:       Cervical exam deferred        Extremities: Normal range of motion.  Edema: Trace  Mental Status: Normal mood and affect. Normal behavior. Normal judgment and thought content.   Urinalysis: Urine Protein: Negative Urine Glucose: Negative  Assessment and Plan:  Pregnancy: [id number] at 1251w1d  1. Supervision of high-risk pregnancy, third trimester Continue prenatal care.   2. Gestational diabetes mellitus (GDM), antepartum, gestational diabetes method of control unspecified Advised of diet, BS checks, risks of GDM including IUFD and LGA. To see Diabetes and Nutrition today Rx for meter.  Preterm labor symptoms and general obstetric precautions including but not limited to  vaginal bleeding, contractions, leaking of fluid and fetal movement were reviewed in detail with the patient. Please refer to After Visit Summary for other counseling recommendations.  Return in 1 week (on 04/28/2015).   Reva Boresanya S Pratt, MD

## 2015-04-21 NOTE — Patient Instructions (Addendum)
Marland Kitchen         4  .     BS      .    .         :    1.  (     )   90.  2. 2         120.    3   3  .   (    )   .            .     (      )      .         .         30       .                         .           ()         ( )  .          .       .                   .        (  ) .  ()          .                          :      30 ().       .     .                     .                       ()       .                 .     :   ().   ().     (   ).  .      .   .  (). .      .    .  .            .               :      .       8       .      Marland Kitchen                 .       (OGTT).             ()  1-3         .           24-28      OGTT    .            2       .               .        .                    .           .                :    ( ):  95  / .    ( ):    :     140  / .    :     120  / .      1     2             :          : 60-99  / .   :  100-129  / .       A1C     .                .         .               .                     .       .      .       Marland Kitchen                .             .       Document Released: 05/10/2000 Document Revised: 02/22/2014 Document Reviewed: 08/31/2011 Elsevier Interactive Patient Education [employer]  Breastfeeding Deciding to breastfeed is one of the best choices you can make for you and your baby. A change in hormones during pregnancy causes your breast tissue to grow and increases the number and size of your milk ducts. These hormones also allow proteins, sugars, and fats from your blood supply to make breast milk in your milk-producing glands. Hormones prevent breast milk from being released before your baby is born as well as prompt milk flow after birth. Once breastfeeding has begun, thoughts of your baby, as well as his or her sucking or crying, can stimulate the release of milk from your milk-producing glands.  BENEFITS OF BREASTFEEDING For Your Baby  Your first milk (colostrum) helps your baby's digestive system function better.  There are antibodies in your milk that help your baby fight off infections.  Your baby has a lower incidence of asthma, allergies, and sudden infant death  syndrome.  The nutrients in breast milk are better for your baby than infant formulas and are designed uniquely for your baby's needs.  Breast milk improves your baby's brain development.  Your baby is less likely to develop other conditions, such as childhood obesity, asthma, or type 2 diabetes mellitus. For You  Breastfeeding helps to create a very special bond between you and your baby.  Breastfeeding is convenient. Breast milk is always available at the correct temperature and costs nothing.  Breastfeeding helps to burn calories and helps you lose the weight gained during pregnancy.  Breastfeeding makes your uterus contract to its prepregnancy size faster and slows bleeding (lochia) after you give birth.   Breastfeeding helps to lower your risk of developing type 2 diabetes mellitus, osteoporosis, and breast or ovarian cancer later in life. SIGNS THAT YOUR BABY IS HUNGRY Early Signs of Hunger  Increased alertness or activity.  Stretching.  Movement of the head from side to side.  Movement of the head and opening of the mouth when the corner of the mouth or cheek is stroked (rooting).  Increased sucking sounds, smacking lips, cooing, sighing, or squeaking.  Hand-to-mouth movements.  Increased sucking of fingers or hands. Late Signs of Hunger  Fussing.  Intermittent crying. Extreme Signs of Hunger Signs  of extreme hunger will require calming and consoling before your baby will be able to breastfeed successfully. Do not wait for the following signs of extreme hunger to occur before you initiate breastfeeding:  Restlessness.  A loud, strong cry.  Screaming. BREASTFEEDING BASICS Breastfeeding Initiation  Find a comfortable place to sit or lie down, with your neck and back well supported.  Place a pillow or rolled up blanket under your baby to bring him or her to the level of your breast (if you are seated). Nursing pillows are specially designed to help support  your arms and your baby while you breastfeed.  Make sure that your baby's abdomen is facing your abdomen.  Gently massage your breast. With your fingertips, massage from your chest wall toward your nipple in a circular motion. This encourages milk flow. You may need to continue this action during the feeding if your milk flows slowly.  Support your breast with 4 fingers underneath and your thumb above your nipple. Make sure your fingers are well away from your nipple and your baby's mouth.  Stroke your baby's lips gently with your finger or nipple.  When your baby's mouth is open wide enough, quickly bring your baby to your breast, placing your entire nipple and as much of the colored area around your nipple (areola) as possible into your baby's mouth.  More areola should be visible above your baby's upper lip than below the lower lip.  Your baby's tongue should be between his or her lower gum and your breast.  Ensure that your baby's mouth is correctly positioned around your nipple (latched). Your baby's lips should create a seal on your breast and be turned out (everted).  It is common for your baby to suck about 2-3 minutes in order to start the flow of breast milk. Latching Teaching your baby how to latch on to your breast properly is very important. An improper latch can cause nipple pain and decreased milk supply for you and poor weight gain in your baby. Also, if your baby is not latched onto your nipple properly, he or she may swallow some air during feeding. This can make your baby fussy. Burping your baby when you switch breasts during the feeding can help to get rid of the air. However, teaching your baby to latch on properly is still the best way to prevent fussiness from swallowing air while breastfeeding. Signs that your baby has successfully latched on to your nipple:  Silent tugging or silent sucking, without causing you pain.  Swallowing heard between every 3-4  sucks.  Muscle movement above and in front of his or her ears while sucking. Signs that your baby has not successfully latched on to nipple:  Sucking sounds or smacking sounds from your baby while breastfeeding.  Nipple pain. If you think your baby has not latched on correctly, slip your finger into the corner of your baby's mouth to break the suction and place it between your baby's gums. Attempt breastfeeding initiation again. Signs of Successful Breastfeeding Signs from your baby:  A gradual decrease in the number of sucks or complete cessation of sucking.  Falling asleep.  Relaxation of his or her body.  Retention of a small amount of milk in his or her mouth.  Letting go of your breast by himself or herself. Signs from you:  Breasts that have increased in firmness, weight, and size 1-3 hours after feeding.  Breasts that are softer immediately after breastfeeding.  Increased milk volume, as  well as a change in milk consistency and color by the fifth day of breastfeeding.  Nipples that are not sore, cracked, or bleeding. Signs That Your Pecola LeisureBaby is Getting Enough Milk  Wetting at least 3 diapers in a 24-hour period. The urine should be clear and pale yellow by age 22 days.  At least 3 stools in a 24-hour period by age 22 days. The stool should be soft and yellow.  At least 3 stools in a 24-hour period by age 779 days. The stool should be seedy and yellow.  No loss of weight greater than 10% of birth weight during the first 493 days of age.  Average weight gain of 4-7 ounces (113-198 g) per week after age 77 days.  Consistent daily weight gain by age 22 days, without weight loss after the age of 2 weeks. After a feeding, your baby may spit up a small amount. This is common. BREASTFEEDING FREQUENCY AND DURATION Frequent feeding will help you make more milk and can prevent sore nipples and breast engorgement. Breastfeed when you feel the need to reduce the fullness of your breasts or  when your baby shows signs of hunger. This is called "breastfeeding on demand." Avoid introducing a pacifier to your baby while you are working to establish breastfeeding (the first 4-6 weeks after your baby is born). After this time you may choose to use a pacifier. Research has shown that pacifier use during the first year of a baby's life decreases the risk of sudden infant death syndrome (SIDS). Allow your baby to feed on each breast as long as he or she wants. Breastfeed until your baby is finished feeding. When your baby unlatches or falls asleep while feeding from the first breast, offer the second breast. Because newborns are often sleepy in the first few weeks of life, you may need to awaken your baby to get him or her to feed. Breastfeeding times will vary from baby to baby. However, the following rules can serve as a guide to help you ensure that your baby is properly fed:  Newborns (babies 374 weeks of age or younger) may breastfeed every 1-3 hours.  Newborns should not go longer than 3 hours during the day or 5 hours during the night without breastfeeding.  You should breastfeed your baby a minimum of 8 times in a 24-hour period until you begin to introduce solid foods to your baby at around 106 months of age. BREAST MILK PUMPING Pumping and storing breast milk allows you to ensure that your baby is exclusively fed your breast milk, even at times when you are unable to breastfeed. This is especially important if you are going back to work while you are still breastfeeding or when you are not able to be present during feedings. Your lactation consultant can give you guidelines on how long it is safe to store breast milk. A breast pump is a machine that allows you to pump milk from your breast into a sterile bottle. The pumped breast milk can then be stored in a refrigerator or freezer. Some breast pumps are operated by hand, while others use electricity. Ask your lactation consultant which type  will work best for you. Breast pumps can be purchased, but some hospitals and breastfeeding support groups lease breast pumps on a monthly basis. A lactation consultant can teach you how to hand express breast milk, if you prefer not to use a pump. CARING FOR YOUR BREASTS WHILE YOU BREASTFEED Nipples can become dry, cracked,  and sore while breastfeeding. The following recommendations can help keep your breasts moisturized and healthy:  Avoid using soap on your nipples.  Wear a supportive bra. Although not required, special nursing bras and tank tops are designed to allow access to your breasts for breastfeeding without taking off your entire bra or top. Avoid wearing underwire-style bras or extremely tight bras.  Air dry your nipples for 3-83minutes after each feeding.  Use only cotton bra pads to absorb leaked breast milk. Leaking of breast milk between feedings is normal.  Use lanolin on your nipples after breastfeeding. Lanolin helps to maintain your skin's normal moisture barrier. If you use pure lanolin, you do not need to wash it off before feeding your baby again. Pure lanolin is not toxic to your baby. You may also hand express a few drops of breast milk and gently massage that milk into your nipples and allow the milk to air dry. In the first few weeks after giving birth, some women experience extremely full breasts (engorgement). Engorgement can make your breasts feel heavy, warm, and tender to the touch. Engorgement peaks within 3-5 days after you give birth. The following recommendations can help ease engorgement:  Completely empty your breasts while breastfeeding or pumping. You may want to start by applying warm, moist heat (in the shower or with warm water-soaked hand towels) just before feeding or pumping. This increases circulation and helps the milk flow. If your baby does not completely empty your breasts while breastfeeding, pump any extra milk after he or she is finished.  Wear  a snug bra (nursing or regular) or tank top for 1-2 days to signal your body to slightly decrease milk production.  Apply ice packs to your breasts, unless this is too uncomfortable for you.  Make sure that your baby is latched on and positioned properly while breastfeeding. If engorgement persists after 48 hours of following these recommendations, contact your health care provider or a Advertising copywriter. OVERALL HEALTH CARE RECOMMENDATIONS WHILE BREASTFEEDING  Eat healthy foods. Alternate between meals and snacks, eating 3 of each per day. Because what you eat affects your breast milk, some of the foods may make your baby more irritable than usual. Avoid eating these foods if you are sure that they are negatively affecting your baby.  Drink milk, fruit juice, and water to satisfy your thirst (about 10 glasses a day).  Rest often, relax, and continue to take your prenatal vitamins to prevent fatigue, stress, and anemia.  Continue breast self-awareness checks.  Avoid chewing and smoking tobacco. Chemicals from cigarettes that pass into breast milk and exposure to secondhand smoke may harm your baby.  Avoid alcohol and drug use, including marijuana. Some medicines that may be harmful to your baby can pass through breast milk. It is important to ask your health care provider before taking any medicine, including all over-the-counter and prescription medicine as well as vitamin and herbal supplements. It is possible to become pregnant while breastfeeding. If birth control is desired, ask your health care provider about options that will be safe for your baby. SEEK MEDICAL CARE IF:  You feel like you want to stop breastfeeding or have become frustrated with breastfeeding.  You have painful breasts or nipples.  Your nipples are cracked or bleeding.  Your breasts are red, tender, or warm.  You have a swollen area on either breast.  You have a fever or chills.  You have nausea or  vomiting.  You have drainage other than breast  milk from your nipples.  Your breasts do not become full before feedings by the fifth day after you give birth.  You feel sad and depressed.  Your baby is too sleepy to eat well.  Your baby is having trouble sleeping.   Your baby is wetting less than 3 diapers in a 24-hour period.  Your baby has less than 3 stools in a 24-hour period.  Your baby's skin or the white part of his or her eyes becomes yellow.   Your baby is not gaining weight by 47 days of age. SEEK IMMEDIATE MEDICAL CARE IF:  Your baby is overly tired (lethargic) and does not want to wake up and feed.  Your baby develops an unexplained fever.   This information is not intended to replace advice given to you by your health care provider. Make sure you discuss any questions you have with your health care provider.   Document Released: 02/01/2005 Document Revised: 10/23/2014 Document Reviewed: 07/26/2012 Elsevier Interactive Patient Education [employer]

## 2015-04-28 ENCOUNTER — Ambulatory Visit (INDEPENDENT_AMBULATORY_CARE_PROVIDER_SITE_OTHER): Payer: Medicaid Other | Admitting: Obstetrics & Gynecology

## 2015-04-28 VITALS — BP 108/69 | HR 86 | Wt 161.0 lb

## 2015-04-28 DIAGNOSIS — O24415 Gestational diabetes mellitus in pregnancy, controlled by oral hypoglycemic drugs: Secondary | ICD-10-CM

## 2015-04-28 DIAGNOSIS — O0993 Supervision of high risk pregnancy, unspecified, third trimester: Secondary | ICD-10-CM

## 2015-04-28 MED ORDER — GLYBURIDE 1.25 MG PO TABS: 1.2500 mg | ORAL_TABLET | Freq: Two times a day (BID) | ORAL | Status: DC

## 2015-04-28 NOTE — Patient Instructions (Signed)
Contraception Choices Contraception (birth control) is the use of any methods or devices to prevent pregnancy. Below are some methods to help avoid pregnancy. HORMONAL METHODS   Contraceptive implant. This is a thin, plastic tube containing progesterone hormone. It does not contain estrogen hormone. Your health care provider inserts the tube in the inner part of the upper arm. The tube can remain in place for up to 3 years. After 3 years, the implant must be removed. The implant prevents the ovaries from releasing an egg (ovulation), thickens the cervical mucus to prevent sperm from entering the uterus, and thins the lining of the inside of the uterus.  Progesterone-only injections. These injections are given every 3 months by your health care provider to prevent pregnancy. This synthetic progesterone hormone stops the ovaries from releasing eggs. It also thickens cervical mucus and changes the uterine lining. This makes it harder for sperm to survive in the uterus.  Birth control pills. These pills contain estrogen and progesterone hormone. They work by preventing the ovaries from releasing eggs (ovulation). They also cause the cervical mucus to thicken, preventing the sperm from entering the uterus. Birth control pills are prescribed by a health care provider.Birth control pills can also be used to treat heavy periods.  Minipill. This type of birth control pill contains only the progesterone hormone. They are taken every day of each month and must be prescribed by your health care provider.  Birth control patch. The patch contains hormones similar to those in birth control pills. It must be changed once a week and is prescribed by a health care provider.  Vaginal ring. The ring contains hormones similar to those in birth control pills. It is left in the vagina for 3 weeks, removed for 1 week, and then a new one is put back in place. The patient must be comfortable inserting and removing the ring  from the vagina.A health care provider's prescription is necessary.  Emergency contraception. Emergency contraceptives prevent pregnancy after unprotected sexual intercourse. This pill can be taken right after sex or up to 5 days after unprotected sex. It is most effective the sooner you take the pills after having sexual intercourse. Most emergency contraceptive pills are available without a prescription. Check with your pharmacist. Do not use emergency contraception as your only form of birth control. BARRIER METHODS   Female condom. This is a thin sheath (latex or rubber) that is worn over the penis during sexual intercourse. It can be used with spermicide to increase effectiveness.  Female condom. This is a soft, loose-fitting sheath that is put into the vagina before sexual intercourse.  Diaphragm. This is a soft, latex, dome-shaped barrier that must be fitted by a health care provider. It is inserted into the vagina, along with a spermicidal jelly. It is inserted before intercourse. The diaphragm should be left in the vagina for 6 to 8 hours after intercourse.  Cervical cap. This is a round, soft, latex or plastic cup that fits over the cervix and must be fitted by a health care provider. The cap can be left in place for up to 48 hours after intercourse.  Sponge. This is a soft, circular piece of polyurethane foam. The sponge has spermicide in it. It is inserted into the vagina after wetting it and before sexual intercourse.  Spermicides. These are chemicals that kill or block sperm from entering the cervix and uterus. They come in the form of creams, jellies, suppositories, foam, or tablets. They do not require a   prescription. They are inserted into the vagina with an applicator before having sexual intercourse. The process must be repeated every time you have sexual intercourse. INTRAUTERINE CONTRACEPTION  Intrauterine device (IUD). This is a T-shaped device that is put in a woman's uterus  during a menstrual period to prevent pregnancy. There are 2 types:  Copper IUD. This type of IUD is wrapped in copper wire and is placed inside the uterus. Copper makes the uterus and fallopian tubes produce a fluid that kills sperm. It can stay in place for 10 years.  Hormone IUD. This type of IUD contains the hormone progestin (synthetic progesterone). The hormone thickens the cervical mucus and prevents sperm from entering the uterus, and it also thins the uterine lining to prevent implantation of a fertilized egg. The hormone can weaken or kill the sperm that get into the uterus. It can stay in place for 3-5 years, depending on which type of IUD is used. PERMANENT METHODS OF CONTRACEPTION  Female tubal ligation. This is when the woman's fallopian tubes are surgically sealed, tied, or blocked to prevent the egg from traveling to the uterus.  Hysteroscopic sterilization. This involves placing a small coil or insert into each fallopian tube. Your doctor uses a technique called hysteroscopy to do the procedure. The device causes scar tissue to form. This results in permanent blockage of the fallopian tubes, so the sperm cannot fertilize the egg. It takes about 3 months after the procedure for the tubes to become blocked. You must use another form of birth control for these 3 months.  Female sterilization. This is when the female has the tubes that carry sperm tied off (vasectomy).This blocks sperm from entering the vagina during sexual intercourse. After the procedure, the man can still ejaculate fluid (semen). NATURAL PLANNING METHODS  Natural family planning. This is not having sexual intercourse or using a barrier method (condom, diaphragm, cervical cap) on days the woman could become pregnant.  Calendar method. This is keeping track of the length of each menstrual cycle and identifying when you are fertile.  Ovulation method. This is avoiding sexual intercourse during ovulation.  Symptothermal  method. This is avoiding sexual intercourse during ovulation, using a thermometer and ovulation symptoms.  Post-ovulation method. This is timing sexual intercourse after you have ovulated. Regardless of which type or method of contraception you choose, it is important that you use condoms to protect against the transmission of sexually transmitted infections (STIs). Talk with your health care provider about which form of contraception is most appropriate for you.   This information is not intended to replace advice given to you by your health care provider. Make sure you discuss any questions you have with your health care provider.   Document Released: 02/01/2005 Document Revised: 02/06/2013 Document Reviewed: 07/27/2012 Elsevier Interactive Patient Education 2016 [employer]. Medroxyprogesterone injection [Contraceptive] What is this medicine? MEDROXYPROGESTERONE (me DROX ee proe JES te rone) contraceptive injections prevent pregnancy. They provide effective birth control for 3 months. Depo-subQ Provera 104 is also used for treating pain related to endometriosis. This medicine may be used for other purposes; ask your health care provider or pharmacist if you have questions. What should I tell my health care provider before I take this medicine? They need to know if you have any of these conditions: -frequently drink alcohol -asthma -blood vessel disease or a history of a blood clot in the lungs or legs -bone disease such as osteoporosis -breast cancer -diabetes -eating disorder (anorexia nervosa or bulimia) -high  blood pressure -HIV infection or AIDS -kidney disease -liver disease -mental depression -migraine -seizures (convulsions) -stroke -tobacco smoker -vaginal bleeding -an unusual or allergic reaction to medroxyprogesterone, other hormones, medicines, foods, dyes, or preservatives -pregnant or trying to get pregnant -breast-feeding How should I use this  medicine? Depo-Provera Contraceptive injection is given into a muscle. Depo-subQ Provera 104 injection is given under the skin. These injections are given by a health care professional. You must not be pregnant before getting an injection. The injection is usually given during the first 5 days after the start of a menstrual period or 6 weeks after delivery of a baby. Talk to your pediatrician regarding the use of this medicine in children. Special care may be needed. These injections have been used in female children who have started having menstrual periods. Overdosage: If you think you have taken too much of this medicine contact a poison control center or emergency room at once. NOTE: This medicine is only for you. Do not share this medicine with others. What if I miss a dose? Try not to miss a dose. You must get an injection once every 3 months to maintain birth control. If you cannot keep an appointment, call and reschedule it. If you wait longer than 13 weeks between Depo-Provera contraceptive injections or longer than 14 weeks between Depo-subQ Provera 104 injections, you could get pregnant. Use another method for birth control if you miss your appointment. You may also need a pregnancy test before receiving another injection. What may interact with this medicine? Do not take this medicine with any of the following medications: -bosentan This medicine may also interact with the following medications: -aminoglutethimide -antibiotics or medicines for infections, especially rifampin, rifabutin, rifapentine, and griseofulvin -aprepitant -barbiturate medicines such as phenobarbital or primidone -bexarotene -carbamazepine -medicines for seizures like ethotoin, felbamate, oxcarbazepine, phenytoin, topiramate -modafinil -St. John's wort This list may not describe all possible interactions. Give your health care provider a list of all the medicines, herbs, non-prescription drugs, or dietary  supplements you use. Also tell them if you smoke, drink alcohol, or use illegal drugs. Some items may interact with your medicine. What should I watch for while using this medicine? This drug does not protect you against HIV infection (AIDS) or other sexually transmitted diseases. Use of this product may cause you to lose calcium from your bones. Loss of calcium may cause weak bones (osteoporosis). Only use this product for more than 2 years if other forms of birth control are not right for you. The longer you use this product for birth control the more likely you will be at risk for weak bones. Ask your health care professional how you can keep strong bones. You may have a change in bleeding pattern or irregular periods. Many females stop having periods while taking this drug. If you have received your injections on time, your chance of being pregnant is very low. If you think you may be pregnant, see your health care professional as soon as possible. Tell your health care professional if you want to get pregnant within the next year. The effect of this medicine may last a long time after you get your last injection. What side effects may I notice from receiving this medicine? Side effects that you should report to your doctor or health care professional as soon as possible: -allergic reactions like skin rash, itching or hives, swelling of the face, lips, or tongue -breast tenderness or discharge -breathing problems -changes in vision -depression -feeling faint or  lightheaded, falls -fever -pain in the abdomen, chest, groin, or leg -problems with balance, talking, walking -unusually weak or tired -yellowing of the eyes or skin Side effects that usually do not require medical attention (report to your doctor or health care professional if they continue or are bothersome): -acne -fluid retention and swelling -headache -irregular periods, spotting, or absent periods -temporary pain, itching,  or skin reaction at site where injected -weight gain This list may not describe all possible side effects. Call your doctor for medical advice about side effects. You may report side effects to FDA at 1-800-FDA-1088. Where should I keep my medicine? This does not apply. The injection will be given to you by a health care professional. NOTE: This sheet is a summary. It may not cover all possible information. If you have questions about this medicine, talk to your doctor, pharmacist, or health care provider.    2016, Elsevier/Gold Standard. (2008-02-23 18:37:56)

## 2015-04-28 NOTE — Progress Notes (Signed)
Subjective:  Bonnie CoinsSamah Rivet is a 30 y.o. [id number] at 3358w1d being seen today for ongoing prenatal care.  She is currently monitored for the following issues for this high-risk pregnancy and has Language barrier; Supervision of high-risk pregnancy; and Gestational diabetes mellitus (GDM), antepartum on her problem list.  Patient reports no complaints.  Contractions: Irregular. Vag. Bleeding: None.  Movement: Present. Denies leaking of fluid.   The following portions of the patient's history were reviewed and updated as appropriate: allergies, current medications, past family history, past medical history, past social history, past surgical history and problem list. Problem list updated.  Objective:   Filed Vitals:   04/28/15 1018  BP: 108/69  Pulse: 86  Weight: 161 lb (73.029 kg)   Past Medical History  Diagnosis Date  . Hyperemesis   . Preterm labor    Past Surgical History  Procedure Laterality Date  . No past surgeries    . Dilation and curettage of uterus      after TAB 2012     Fetal Status: Fetal Heart Rate (bpm): 138   Movement: Present     General:  Alert, oriented and cooperative. Patient is in no acute distress.  Skin: Skin is warm and dry. No rash noted.   Cardiovascular: Normal heart rate noted  Respiratory: Normal respiratory effort, no problems with respiration noted  Abdomen: Soft, gravid, appropriate for gestational age. Pain/Pressure: Present     Pelvic: Vag. Bleeding: None     Cervical exam deferred        Extremities: Normal range of motion.  Edema: Trace  Mental Status: Normal mood and affect. Normal behavior. Normal judgment and thought content.   Urinalysis: RN to chart urine.  Assessment and Plan:  Pregnancy: [id number] at 6858w1d  1. Supervision of high-risk pregnancy, third trimester Worsening gestational diabetes requiring initiation of medication and antenatal testing.   - Gestational diabtese--fasting are incureased 95-122.  Pt not eating dinner.  Pp  breakfast 93-177 (122, 177 were two high values)   Pp lunch are high 101-272.  A snack at night would improve morning glycemic control.  Start glyburide 1.25 mg bid.    -US growth at 38 1/2 weeks.  Preterm labor symptoms and general obstetric precautions including but not limited to vaginal bleeding, contractions, leaking of fluid and fetal movement were reviewed in detail with the patient. Please refer to After Visit Summary for other counseling recommendations.    Lesly DukesKelly H Stephano Arrants, MD

## 2015-04-28 NOTE — Progress Notes (Signed)
Nuha used for interpreter Patient reports lower abdominal cramping and lower back pain yesterday with irregular contractions throughout the day; reports 1 contraction since 7am today.  Reviewed tip of week with patient

## 2015-04-29 ENCOUNTER — Ambulatory Visit (INDEPENDENT_AMBULATORY_CARE_PROVIDER_SITE_OTHER): Payer: Medicaid Other | Admitting: *Deleted

## 2015-04-29 DIAGNOSIS — O24415 Gestational diabetes mellitus in pregnancy, controlled by oral hypoglycemic drugs: Secondary | ICD-10-CM | POA: Diagnosis not present

## 2015-04-29 NOTE — Progress Notes (Signed)
Video interpreter Amal # [id number] used for encounter.

## 2015-04-29 NOTE — Progress Notes (Signed)
NST performed today was reviewed and was found to be reactive.  Continue recommended antenatal testing and prenatal care.  

## 2015-05-01 NOTE — Progress Notes (Signed)
Patient did not leave urine sample today before leaving clinic

## 2015-05-02 ENCOUNTER — Ambulatory Visit (INDEPENDENT_AMBULATORY_CARE_PROVIDER_SITE_OTHER): Payer: Medicaid Other | Admitting: *Deleted

## 2015-05-02 VITALS — BP 104/70 | HR 92

## 2015-05-02 DIAGNOSIS — O24419 Gestational diabetes mellitus in pregnancy, unspecified control: Secondary | ICD-10-CM | POA: Diagnosis present

## 2015-05-02 DIAGNOSIS — Z36 Encounter for antenatal screening of mother: Secondary | ICD-10-CM

## 2015-05-02 NOTE — Progress Notes (Signed)
NST reviewed and reactive.  Tyniesha Howald L. Harraway-Smith, M.D., FACOG    

## 2015-05-05 ENCOUNTER — Ambulatory Visit (INDEPENDENT_AMBULATORY_CARE_PROVIDER_SITE_OTHER): Payer: Medicaid Other | Admitting: Family Medicine

## 2015-05-05 VITALS — BP 99/69 | HR 91 | Wt 162.0 lb

## 2015-05-05 DIAGNOSIS — Z789 Other specified health status: Secondary | ICD-10-CM

## 2015-05-05 DIAGNOSIS — O0993 Supervision of high risk pregnancy, unspecified, third trimester: Secondary | ICD-10-CM | POA: Diagnosis not present

## 2015-05-05 DIAGNOSIS — O24419 Gestational diabetes mellitus in pregnancy, unspecified control: Secondary | ICD-10-CM | POA: Diagnosis present

## 2015-05-05 MED ORDER — GLYBURIDE 5 MG PO TABS: 5.0000 mg | ORAL_TABLET | Freq: Two times a day (BID) | ORAL | Status: DC

## 2015-05-05 NOTE — Patient Instructions (Addendum)
Increasing your glyburide  - starting taking  twice daily   Third Trimester of Pregnancy The third trimester is from week 29 through week 42, months 7 through 9. The third trimester is a time when the fetus is growing rapidly. At the end of the ninth month, the fetus is about 20 inches in length and weighs 6-10 pounds.  BODY CHANGES Your body goes through many changes during pregnancy. The changes vary from woman to woman.   Your weight will continue to increase. You can expect to gain 25-35 pounds (11-16 kg) by the end of the pregnancy.  You may begin to get stretch marks on your hips, abdomen, and breasts.  You may urinate more often because the fetus is moving lower into your pelvis and pressing on your bladder.  You may develop or continue to have heartburn as a result of your pregnancy.  You may develop constipation because certain hormones are causing the muscles that push waste through your intestines to slow down.  You may develop hemorrhoids or swollen, bulging veins (varicose veins).  You may have pelvic pain because of the weight gain and pregnancy hormones relaxing your joints between the bones in your pelvis. Backaches may result from overexertion of the muscles supporting your posture.  You may have changes in your hair. These can include thickening of your hair, rapid growth, and changes in texture. Some women also have hair loss during or after pregnancy, or hair that feels dry or thin. Your hair will most likely return to normal after your baby is born.  Your breasts will continue to grow and be tender. A yellow discharge may leak from your breasts called colostrum.  Your belly button may stick out.  You may feel short of breath because of your expanding uterus.  You may notice the fetus "dropping," or moving lower in your abdomen.  You may have a bloody mucus discharge. This usually occurs a few days to a week before labor begins.  Your cervix becomes thin and  soft (effaced) near your due date. WHAT TO EXPECT AT YOUR PRENATAL EXAMS  You will have prenatal exams every 2 weeks until week 36. Then, you will have weekly prenatal exams. During a routine prenatal visit:  You will be weighed to make sure you and the fetus are growing normally.  Your blood pressure is taken.  Your abdomen will be measured to track your baby's growth.  The fetal heartbeat will be listened to.  Any test results from the previous visit will be discussed.  You may have a cervical check near your due date to see if you have effaced. At around 36 weeks, your caregiver will check your cervix. At the same time, your caregiver will also perform a test on the secretions of the vaginal tissue. This test is to determine if a type of bacteria, Group B streptococcus, is present. Your caregiver will explain this further. Your caregiver may ask you:  What your birth plan is.  How you are feeling.  If you are feeling the baby move.  If you have had any abnormal symptoms, such as leaking fluid, bleeding, severe headaches, or abdominal cramping.  If you are using any tobacco products, including cigarettes, chewing tobacco, and electronic cigarettes.  If you have any questions. Other tests or screenings that may be performed during your third trimester include:  Blood tests that check for low iron levels (anemia).  Fetal testing to check the health, activity level, and growth of the  fetus. Testing is done if you have certain medical conditions or if there are problems during the pregnancy.  HIV (human immunodeficiency virus) testing. If you are at high risk, you may be screened for HIV during your third trimester of pregnancy. FALSE LABOR You may feel small, irregular contractions that eventually go away. These are called Braxton Hicks contractions, or false labor. Contractions may last for hours, days, or even weeks before true labor sets in. If contractions come at regular  intervals, intensify, or become painful, it is best to be seen by your caregiver.  SIGNS OF LABOR   Menstrual-like cramps.  Contractions that are 5 minutes apart or less.  Contractions that start on the top of the uterus and spread down to the lower abdomen and back.  A sense of increased pelvic pressure or back pain.  A watery or bloody mucus discharge that comes from the vagina. If you have any of these signs before the 37th week of pregnancy, call your caregiver right away. You need to go to the hospital to get checked immediately. HOME CARE INSTRUCTIONS   Avoid all smoking, herbs, alcohol, and unprescribed drugs. These chemicals affect the formation and growth of the baby.  Do not use any tobacco products, including cigarettes, chewing tobacco, and electronic cigarettes. If you need help quitting, ask your health care provider. You may receive counseling support and other resources to help you quit.  Follow your caregiver's instructions regarding medicine use. There are medicines that are either safe or unsafe to take during pregnancy.  Exercise only as directed by your caregiver. Experiencing uterine cramps is a good sign to stop exercising.  Continue to eat regular, healthy meals.  Wear a good support bra for breast tenderness.  Do not use hot tubs, steam rooms, or saunas.  Wear your seat belt at all times when driving.  Avoid raw meat, uncooked cheese, cat litter boxes, and soil used by cats. These carry germs that can cause birth defects in the baby.  Take your prenatal vitamins.  Take 1500-2000 mg of calcium daily starting at the 20th week of pregnancy until you deliver your baby.  Try taking a stool softener (if your caregiver approves) if you develop constipation. Eat more high-fiber foods, such as fresh vegetables or fruit and whole grains. Drink plenty of fluids to keep your urine clear or pale yellow.  Take warm sitz baths to soothe any pain or discomfort caused  by hemorrhoids. Use hemorrhoid cream if your caregiver approves.  If you develop varicose veins, wear support hose. Elevate your feet for 15 minutes, 3-4 times a day. Limit salt in your diet.  Avoid heavy lifting, wear low heal shoes, and practice good posture.  Rest a lot with your legs elevated if you have leg cramps or low back pain.  Visit your dentist if you have not gone during your pregnancy. Use a soft toothbrush to brush your teeth and be gentle when you floss.  A sexual relationship may be continued unless your caregiver directs you otherwise.  Do not travel far distances unless it is absolutely necessary and only with the approval of your caregiver.  Take prenatal classes to understand, practice, and ask questions about the labor and delivery.  Make a trial run to the hospital.  Pack your hospital bag.  Prepare the baby's nursery.  Continue to go to all your prenatal visits as directed by your caregiver. SEEK MEDICAL CARE IF:  You are unsure if you are in labor or  if your water has broken.  You have dizziness.  You have mild pelvic cramps, pelvic pressure, or nagging pain in your abdominal area.  You have persistent nausea, vomiting, or diarrhea.  You have a bad smelling vaginal discharge.  You have pain with urination. SEEK IMMEDIATE MEDICAL CARE IF:   You have a fever.  You are leaking fluid from your vagina.  You have spotting or bleeding from your vagina.  You have severe abdominal cramping or pain.  You have rapid weight loss or gain.  You have shortness of breath with chest pain.  You notice sudden or extreme swelling of your face, hands, ankles, feet, or legs.  You have not felt your baby move in over an hour.  You have severe headaches that do not go away with medicine.  You have vision changes.   This information is not intended to replace advice given to you by your health care provider. Make sure you discuss any questions you have with  your health care provider.   Document Released: 01/26/2001 Document Revised: 02/22/2014 Document Reviewed: 04/04/2012 Elsevier Interactive Patient Education [employer]

## 2015-05-05 NOTE — Progress Notes (Signed)
Subjective:  Bonnie [name] is a [age] y.o. [id number] at 2450w1d being seen today for ongoing prenatal care.  She is currently monitored for the following issues for this high-risk pregnancy and has Language barrier; Supervision of high-risk pregnancy; and Gestational diabetes mellitus (GDM), antepartum on her problem list.  Patient reports no complaints.  Contractions: Irregular. Vag. Bleeding: None.  Movement: Present. Denies leaking of fluid.   The following portions of the patient's history were reviewed and updated as appropriate: allergies, current medications, past family history, past medical history, past social history, past surgical history and problem list. Problem list updated.  Objective:   Filed Vitals:   05/05/15 0902  BP: 99/69  Pulse: 91    Fetal Status: Fetal Heart Rate (bpm): NST   Movement: Present     General:  Alert, oriented and cooperative. Patient is in no acute distress.  Skin: Skin is warm and dry. No rash noted.   Cardiovascular: Normal heart rate noted  Respiratory: Normal respiratory effort, no problems with respiration noted  Abdomen: Soft, gravid, appropriate for gestational age. Pain/Pressure: Present     Pelvic: Vag. Bleeding: None     Cervical exam deferred        Extremities: Normal range of motion.     Mental Status: Normal mood and affect. Normal behavior. Normal judgment and thought content.   Urinalysis:      Fasting 100-130 All above range 2hr PP B: 99-172  (5 values, 3 above goal) 2hr PP L: 122-142 (5 values , all above goal) 2 hr PP D: 102- 125 (5 values, 1 above goal)  Current dose: Glyburide 2.5 mg BID  Assessment and Plan:  Pregnancy: [id number] at 5450w1d  1. Gestational diabetes mellitus (GDM), antepartum, gestational diabetes method of control unspecified - Increased glyburide to 5 mg BID (sent in rx) but patient will take 4 pills (1.25mg  tabs) BID until she runs out  - Fetal nonstress test - Schedule growth US 38wk   2. Language  barrier arabic  3. Supervision of high-risk pregnancy, third trimester [facility]  Preterm labor symptoms and general obstetric precautions including but not limited to vaginal bleeding, contractions, leaking of fluid and fetal movement were reviewed in detail with the patient. Please refer to After Visit Summary for other counseling recommendations.   Return in about 4 days (around 05/09/2015) for as scheduled.  Federico FlakeKimberly Niles Kenae Lindquist, MD  Future Appointments Date Time Provider Department Center  05/09/2015 11:00 AM WOC-WOCA NST WOC-WOCA WOC  05/12/2015 9:30 AM WOC-WOCA NST WOC-WOCA WOC  05/22/2015 8:15 AM WH-MFC US 3 WH-US 203

## 2015-05-05 NOTE — Progress Notes (Signed)
Video Interpreter "Maisa"  # [phone] used for encounter.

## 2015-05-09 ENCOUNTER — Ambulatory Visit (INDEPENDENT_AMBULATORY_CARE_PROVIDER_SITE_OTHER): Payer: Medicaid Other | Admitting: *Deleted

## 2015-05-09 VITALS — BP 109/69 | HR 92

## 2015-05-09 DIAGNOSIS — Z36 Encounter for antenatal screening of mother: Secondary | ICD-10-CM | POA: Diagnosis not present

## 2015-05-09 DIAGNOSIS — O24419 Gestational diabetes mellitus in pregnancy, unspecified control: Secondary | ICD-10-CM | POA: Diagnosis not present

## 2015-05-09 NOTE — Progress Notes (Signed)
Amniotic fluid subjectively increased with quantitative AFI - 23.2 cm. Glyburide was increased 4 days ago and pt reports still having some elevated blood sugars. Pt advised to adhere to diabetic diet and bring CBG log book to next appt on 3/27.

## 2015-05-12 ENCOUNTER — Other Ambulatory Visit (HOSPITAL_COMMUNITY)
Admission: RE | Admit: 2015-05-12 | Discharge: 2015-05-12 | Disposition: A | Discharge status: Home / Self Care | Payer: Medicaid Other | Source: Ambulatory Visit | Attending: Obstetrics and Gynecology | Admitting: Obstetrics and Gynecology

## 2015-05-12 ENCOUNTER — Ambulatory Visit (INDEPENDENT_AMBULATORY_CARE_PROVIDER_SITE_OTHER): Payer: Medicaid Other | Admitting: Obstetrics and Gynecology

## 2015-05-12 ENCOUNTER — Encounter: Payer: Self-pay | Admitting: Obstetrics and Gynecology

## 2015-05-12 VITALS — BP 105/67 | HR 91 | Wt 165.6 lb

## 2015-05-12 DIAGNOSIS — Z789 Other specified health status: Secondary | ICD-10-CM | POA: Diagnosis not present

## 2015-05-12 DIAGNOSIS — Z113 Encounter for screening for infections with a predominantly sexual mode of transmission: Secondary | ICD-10-CM

## 2015-05-12 DIAGNOSIS — O24419 Gestational diabetes mellitus in pregnancy, unspecified control: Secondary | ICD-10-CM

## 2015-05-12 DIAGNOSIS — O0993 Supervision of high risk pregnancy, unspecified, third trimester: Secondary | ICD-10-CM

## 2015-05-12 LAB — OB RESULTS CONSOLE GBS: STREP GROUP B AG: NEGATIVE

## 2015-05-12 NOTE — Progress Notes (Signed)
3/27 NST reviewed and reactive

## 2015-05-12 NOTE — Progress Notes (Signed)
Subjective:  Bonnie [name] is a 30 y.o. [id number] at 3480w1d being seen today for ongoing prenatal care.  She is currently monitored for the following issues for this high-risk pregnancy and has Language barrier; Supervision of high-risk pregnancy; and Gestational diabetes mellitus (GDM), antepartum on her problem list.  Patient reports no complaints.  Contractions: Irregular. Vag. Bleeding: None.  Movement: Present. Denies leaking of fluid.   The following portions of the patient's history were reviewed and updated as appropriate: allergies, current medications, past family history, past medical history, past social history, past surgical history and problem list. Problem list updated.  Objective:   Filed Vitals:   05/12/15 1013  BP: 105/67  Pulse: 91  Weight: 165 lb 9.6 oz (75.116 kg)    Fetal Status: Fetal Heart Rate (bpm): NST Fundal Height: 36 cm Movement: Present  Presentation: Vertex  General:  Alert, oriented and cooperative. Patient is in no acute distress.  Skin: Skin is warm and dry. No rash noted.   Cardiovascular: Normal heart rate noted  Respiratory: Normal respiratory effort, no problems with respiration noted  Abdomen: Soft, gravid, appropriate for gestational age. Pain/Pressure: Present     Pelvic: Vag. Bleeding: None     Cervical exam performed Dilation: 1.5 Effacement (%): 50 Station: -3  Extremities: Normal range of motion.  Edema: Trace  Mental Status: Normal mood and affect. Normal behavior. Normal judgment and thought content.   Urinalysis:      Assessment and Plan:  Pregnancy: [id number] at 5380w1d  1. Gestational diabetes mellitus (GDM), antepartum, gestational diabetes method of control unspecified CBGs reviewed and majprity within range. Highest fasting 122 and pp within range. Patient is not checking consistently - Continue current dose of glyburide - Advised to consume a protein rich snack at bedtime - Culture, beta strep (group b only) - GC/Chlamydia probe amp  ([place])not at [facility] - Fetal nonstress test  2. Language barrier Interpreter present for the encounter  3. Supervision of high-risk pregnancy, third trimester Cultures collected today Follow up growth ultrasound 05/22/15   Preterm labor symptoms and general obstetric precautions including but not limited to vaginal bleeding, contractions, leaking of fluid and fetal movement were reviewed in detail with the patient. Please refer to After Visit Summary for other counseling recommendations.  Return in about 4 days (around 05/16/2015) for 2x/wk as scheduled.   Catalina AntiguaPeggy Bralin Garry, MD

## 2015-05-12 NOTE — Progress Notes (Signed)
Interpreter Daleen BoFeryal Yousef present for encounter. US for growth scheduled 4/6.

## 2015-05-13 LAB — GC/CHLAMYDIA PROBE AMP (~~LOC~~) NOT AT ARMC
Chlamydia: NEGATIVE
Neisseria Gonorrhea: NEGATIVE

## 2015-05-14 ENCOUNTER — Telehealth: Payer: Self-pay

## 2015-05-14 NOTE — Telephone Encounter (Signed)
Linus OrnMary Shaw from [facility] to inform us that this patient is SEXUALLY active with her husband that is Chronic Hep B positive. The disease control nurse called her to let us know. Sh was not sure what she should do so she called us to let us know and put down in her chart.

## 2015-05-15 ENCOUNTER — Encounter: Payer: Self-pay | Admitting: *Deleted

## 2015-05-15 DIAGNOSIS — Z205 Contact with and (suspected) exposure to viral hepatitis: Secondary | ICD-10-CM | POA: Insufficient documentation

## 2015-05-15 LAB — CULTURE, BETA STREP (GROUP B ONLY)

## 2015-05-16 ENCOUNTER — Ambulatory Visit (INDEPENDENT_AMBULATORY_CARE_PROVIDER_SITE_OTHER): Payer: Medicaid Other | Admitting: *Deleted

## 2015-05-16 VITALS — BP 102/63 | HR 92

## 2015-05-16 DIAGNOSIS — O24415 Gestational diabetes mellitus in pregnancy, controlled by oral hypoglycemic drugs: Secondary | ICD-10-CM

## 2015-05-16 DIAGNOSIS — Z36 Encounter for antenatal screening of mother: Secondary | ICD-10-CM | POA: Diagnosis not present

## 2015-05-16 NOTE — Progress Notes (Signed)
3/31 NST reviewed and reactive 

## 2015-05-16 NOTE — Progress Notes (Signed)
[employer] video interpreter Hortense Ramalreny [phone]#[id number] used for encounter.

## 2015-05-19 ENCOUNTER — Ambulatory Visit (INDEPENDENT_AMBULATORY_CARE_PROVIDER_SITE_OTHER): Payer: Medicaid Other | Admitting: Family Medicine

## 2015-05-19 VITALS — BP 108/59 | HR 70 | Wt 171.7 lb

## 2015-05-19 DIAGNOSIS — Z789 Other specified health status: Secondary | ICD-10-CM

## 2015-05-19 DIAGNOSIS — Z205 Contact with and (suspected) exposure to viral hepatitis: Secondary | ICD-10-CM

## 2015-05-19 DIAGNOSIS — O0993 Supervision of high risk pregnancy, unspecified, third trimester: Secondary | ICD-10-CM

## 2015-05-19 DIAGNOSIS — O24415 Gestational diabetes mellitus in pregnancy, controlled by oral hypoglycemic drugs: Secondary | ICD-10-CM

## 2015-05-19 LAB — FETAL NONSTRESS TEST

## 2015-05-19 LAB — POCT URINALYSIS DIP (DEVICE)
Bilirubin Urine: NEGATIVE
GLUCOSE, UA: NEGATIVE mg/dL
Hgb urine dipstick: NEGATIVE
Ketones, ur: NEGATIVE mg/dL
NITRITE: NEGATIVE
PROTEIN: NEGATIVE mg/dL
SPECIFIC GRAVITY, URINE: 1.01 (ref 1.005–1.030)
UROBILINOGEN UA: 0.2 mg/dL (ref 0.0–1.0)
pH: 7 (ref 5.0–8.0)

## 2015-05-19 NOTE — Patient Instructions (Signed)
You labor will be induced (started) when you are 39 weeks.   Labor Induction Labor induction is when steps are taken to cause a pregnant woman to begin the labor process. Most women go into labor on their own between 37 weeks and 42 weeks of the pregnancy. When this does not happen or when there is a medical need, methods may be used to induce labor. Labor induction causes a pregnant woman's uterus to contract. It also causes the cervix to soften (ripen), open (dilate), and thin out (efface). Usually, labor is not induced before 39 weeks of the pregnancy unless there is a problem with the baby or mother.  Before inducing labor, your health care provider will consider a number of factors, including the following:  The medical condition of you and the baby.   How many weeks along you are.   The status of the baby's lung maturity.   The condition of the cervix.   The position of the baby.  WHAT ARE THE REASONS FOR LABOR INDUCTION? Labor may be induced for the following reasons:  The health of the baby or mother is at risk.   The pregnancy is overdue by 1 week or more.   The water breaks but labor does not start on its own.   The mother has a health condition or serious illness, such as high blood pressure, infection, placental abruption, or diabetes.  The amniotic fluid amounts are low around the baby.   The baby is distressed.  Convenience or wanting the baby to be born on a certain date is not a reason for inducing labor. WHAT METHODS ARE USED FOR LABOR INDUCTION? Several methods of labor induction may be used, such as:   Prostaglandin medicine. This medicine causes the cervix to dilate and ripen. The medicine will also start contractions. It can be taken by mouth or by inserting a suppository into the vagina.   Inserting a thin tube (catheter) with a balloon on the end into the vagina to dilate the cervix. Once inserted, the balloon is expanded with water, which causes  the cervix to open.   Stripping the membranes. Your health care provider separates amniotic sac tissue from the cervix, causing the cervix to be stretched and causing the release of a hormone called progesterone. This may cause the uterus to contract. It is often done during an office visit. You will be sent home to wait for the contractions to begin. You will then come in for an induction.   Breaking the water. Your health care provider makes a hole in the amniotic sac using a small instrument. Once the amniotic sac breaks, contractions should begin. This may still take hours to see an effect.   Medicine to trigger or strengthen contractions. This medicine is given through an IV access tube inserted into a vein in your arm.  All of the methods of induction, besides stripping the membranes, will be done in the hospital. Induction is done in the hospital so that you and the baby can be carefully monitored.  HOW LONG DOES IT TAKE FOR LABOR TO BE INDUCED? Some inductions can take up to 2-3 days. Depending on the cervix, it usually takes less time. It takes longer when you are induced early in the pregnancy or if this is your first pregnancy. If a mother is still pregnant and the induction has been going on for 2-3 days, either the mother will be sent home or a cesarean delivery will be needed. WHAT ARE  THE RISKS ASSOCIATED WITH LABOR INDUCTION? Some of the risks of induction include:   Changes in fetal heart rate, such as too high, too low, or erratic.   Fetal distress.   Chance of infection for the mother and baby.   Increased chance of having a cesarean delivery.   Breaking off (abruption) of the placenta from the uterus (rare).   Uterine rupture (very rare).  When induction is needed for medical reasons, the benefits of induction may outweigh the risks. WHAT ARE SOME REASONS FOR NOT INDUCING LABOR? Labor induction should not be done if:   It is shown that your baby does not  tolerate labor.   You have had previous surgeries on your uterus, such as a myomectomy or the removal of fibroids.   Your placenta lies very low in the uterus and blocks the opening of the cervix (placenta previa).   Your baby is not in a head-down position.   The umbilical cord drops down into the birth canal in front of the baby. This could cut off the baby's blood and oxygen supply.   You have had a previous cesarean delivery.   There are unusual circumstances, such as the baby being extremely premature.    This information is not intended to replace advice given to you by your health care provider. Make sure you discuss any questions you have with your health care provider.   Document Released: 06/23/2006 Document Revised: 02/22/2014 Document Reviewed: 08/31/2012 Elsevier Interactive Patient Education [employer]

## 2015-05-19 NOTE — Progress Notes (Signed)
Subjective:  Bonnie [name] is a 30 y.o. [id number] at 216w1d being seen today for ongoing prenatal care.  She is currently monitored for the following issues for this low-risk pregnancy and has Language barrier; Supervision of high-risk pregnancy; Gestational diabetes mellitus (GDM), antepartum; and Perinatal hepatitis B exposure on her problem list.  Patient reports Vaginal pressure, towards vagina.  Contractions: Irregular. Vag. Bleeding: None.  Movement: Present. Denies leaking of fluid.  Reports mild contractions, not regular, annoying.  BS 100 today   The following portions of the patient's history were reviewed and updated as appropriate: allergies, current medications, past family history, past medical history, past social history, past surgical history and problem list. Problem list updated.  Objective:   Filed Vitals:   05/19/15 0958  BP: 108/59  Pulse: 70    Fetal Status: Fetal Heart Rate (bpm): NST   Movement: Present     General:  Alert, oriented and cooperative. Patient is in no acute distress.  Skin: Skin is warm and dry. No rash noted.   Cardiovascular: Normal heart rate noted  Respiratory: Normal respiratory effort, no problems with respiration noted  Abdomen: Soft, gravid, appropriate for gestational age. Pain/Pressure: Present     Pelvic: Vag. Bleeding: None     Cervical exam deferred        Extremities: Normal range of motion.     Mental Status: Normal mood and affect. Normal behavior. Normal judgment and thought content.   Urinalysis: Urine Protein: Negative Urine Glucose: Negative  Assessment and Plan:  Pregnancy: [id number] at 856w1d  1. Gestational diabetes mellitus (GDM) controlled on oral hypoglycemic drug, antepartum - Forgot Log today-- reports fasting BS today was 100. - US scheduled for next week for fetal growth - Fetal nonstress test - Scheduled IOL today   2. Language barrier Arabic speaking  3. Perinatal hepatitis B exposure Antigen negative  4.  Supervision of high-risk pregnancy, third trimester Reviewed pregnancy box  Preterm labor symptoms and general obstetric precautions including but not limited to vaginal bleeding, contractions, leaking of fluid and fetal movement were reviewed in detail with the patient. Please refer to After Visit Summary for other counseling recommendations.   Return for as scheduled.   Future Appointments Date Time Provider Department Center  05/22/2015 8:15 AM WH-MFC US 3 WH-US 203  05/22/2015 9:30 AM WOC-WOCA NST WOC-WOCA WOC   Federico FlakeKimberly Niles Jamieon Lannen, MD

## 2015-05-19 NOTE — Progress Notes (Signed)
Growth US scheduled 4/6.  [employer] interpreter # [phone] used for encounter.

## 2015-05-20 NOTE — Progress Notes (Signed)
NST reactive on 05/09/15

## 2015-05-21 ENCOUNTER — Inpatient Hospital Stay (HOSPITAL_COMMUNITY)
Admission: AD | Admit: 2015-05-21 | Discharge: 2015-05-22 | DRG: 775 | Disposition: A | Discharge status: Home / Self Care | Payer: Medicaid Other | Source: Ambulatory Visit | Attending: Obstetrics & Gynecology | Admitting: Obstetrics & Gynecology

## 2015-05-21 ENCOUNTER — Encounter (HOSPITAL_COMMUNITY): Payer: Self-pay | Admitting: *Deleted

## 2015-05-21 DIAGNOSIS — Z833 Family history of diabetes mellitus: Secondary | ICD-10-CM

## 2015-05-21 DIAGNOSIS — O24425 Gestational diabetes mellitus in childbirth, controlled by oral hypoglycemic drugs: Secondary | ICD-10-CM | POA: Diagnosis not present

## 2015-05-21 DIAGNOSIS — Z8249 Family history of ischemic heart disease and other diseases of the circulatory system: Secondary | ICD-10-CM | POA: Diagnosis not present

## 2015-05-21 DIAGNOSIS — Z3A37 37 weeks gestation of pregnancy: Secondary | ICD-10-CM

## 2015-05-21 DIAGNOSIS — IMO0001 Reserved for inherently not codable concepts without codable children: Secondary | ICD-10-CM

## 2015-05-21 DIAGNOSIS — O0993 Supervision of high risk pregnancy, unspecified, third trimester: Secondary | ICD-10-CM

## 2015-05-21 DIAGNOSIS — O24415 Gestational diabetes mellitus in pregnancy, controlled by oral hypoglycemic drugs: Secondary | ICD-10-CM

## 2015-05-21 LAB — CBC
HCT: 32.3 % — ABNORMAL LOW (ref 36.0–46.0)
HEMOGLOBIN: 10.1 g/dL — AB (ref 12.0–15.0)
MCH: 23.7 pg — AB (ref 26.0–34.0)
MCHC: 31.3 g/dL (ref 30.0–36.0)
MCV: 75.8 fL — AB (ref 78.0–100.0)
Platelets: 221 10*3/uL (ref 150–400)
RBC: 4.26 MIL/uL (ref 3.87–5.11)
RDW: 15.3 % (ref 11.5–15.5)
WBC: 14 10*3/uL — AB (ref 4.0–10.5)

## 2015-05-21 LAB — ABO/RH: ABO/RH(D): O POS

## 2015-05-21 LAB — GLUCOSE, CAPILLARY
GLUCOSE-CAPILLARY: 90 mg/dL (ref 65–99)
Glucose-Capillary: 64 mg/dL — ABNORMAL LOW (ref 65–99)

## 2015-05-21 LAB — TYPE AND SCREEN
ABO/RH(D): O POS
Antibody Screen: NEGATIVE

## 2015-05-21 MED ORDER — OXYCODONE-ACETAMINOPHEN 5-325 MG PO TABS: 2.0000 | ORAL_TABLET | ORAL | Status: DC | PRN

## 2015-05-21 MED ORDER — KCL IN DEXTROSE-NACL 20-5-0.45 MEQ/L-%-% IV SOLN
INTRAVENOUS | Status: DC
  Filled 2015-05-21 (×2): qty 1000

## 2015-05-21 MED ORDER — DIBUCAINE 1 % RE OINT: 1.0000 "application " | TOPICAL_OINTMENT | RECTAL | Status: DC | PRN

## 2015-05-21 MED ORDER — BENZOCAINE-MENTHOL 20-0.5 % EX AERO: 1.0000 "application " | INHALATION_SPRAY | CUTANEOUS | Status: DC | PRN

## 2015-05-21 MED ORDER — ONDANSETRON HCL 4 MG PO TABS: 4.0000 mg | ORAL_TABLET | ORAL | Status: DC | PRN

## 2015-05-21 MED ORDER — ZOLPIDEM TARTRATE 5 MG PO TABS: 5.0000 mg | ORAL_TABLET | Freq: Every evening | ORAL | Status: DC | PRN

## 2015-05-21 MED ORDER — OXYTOCIN BOLUS FROM INFUSION: 500.0000 mL | INTRAVENOUS | Status: DC

## 2015-05-21 MED ORDER — SIMETHICONE 80 MG PO CHEW: 80.0000 mg | CHEWABLE_TABLET | ORAL | Status: DC | PRN

## 2015-05-21 MED ORDER — ACETAMINOPHEN 325 MG PO TABS
650.0000 mg | ORAL_TABLET | ORAL | Status: DC | PRN
  Administered 2015-05-21 – 2015-05-22 (×3): 650 mg via ORAL
  Filled 2015-05-21 (×3): qty 2

## 2015-05-21 MED ORDER — CITRIC ACID-SODIUM CITRATE 334-500 MG/5ML PO SOLN: 30.0000 mL | ORAL | Status: DC | PRN

## 2015-05-21 MED ORDER — LANOLIN HYDROUS EX OINT: TOPICAL_OINTMENT | CUTANEOUS | Status: DC | PRN

## 2015-05-21 MED ORDER — LIDOCAINE HCL (PF) 1 % IJ SOLN
30.0000 mL | INTRAMUSCULAR | Status: DC | PRN
Start: 2015-05-21 — End: 2015-05-21
  Filled 2015-05-21: qty 30

## 2015-05-21 MED ORDER — ONDANSETRON HCL 4 MG/2ML IJ SOLN: 4.0000 mg | INTRAMUSCULAR | Status: DC | PRN

## 2015-05-21 MED ORDER — ACETAMINOPHEN 325 MG PO TABS: 650.0000 mg | ORAL_TABLET | ORAL | Status: DC | PRN

## 2015-05-21 MED ORDER — DIPHENHYDRAMINE HCL 25 MG PO CAPS: 25.0000 mg | ORAL_CAPSULE | Freq: Four times a day (QID) | ORAL | Status: DC | PRN

## 2015-05-21 MED ORDER — WITCH HAZEL-GLYCERIN EX PADS: 1.0000 "application " | MEDICATED_PAD | CUTANEOUS | Status: DC | PRN

## 2015-05-21 MED ORDER — IBUPROFEN 600 MG PO TABS
600.0000 mg | ORAL_TABLET | Freq: Four times a day (QID) | ORAL | Status: DC
  Administered 2015-05-21 – 2015-05-22 (×4): 600 mg via ORAL
  Filled 2015-05-21 (×4): qty 1

## 2015-05-21 MED ORDER — LACTATED RINGERS IV SOLN: 500.0000 mL | INTRAVENOUS | Status: DC | PRN

## 2015-05-21 MED ORDER — PRENATAL MULTIVITAMIN CH
1.0000 | ORAL_TABLET | Freq: Every day | ORAL | Status: DC
  Administered 2015-05-22: 1 via ORAL
  Filled 2015-05-21: qty 1

## 2015-05-21 MED ORDER — SENNOSIDES-DOCUSATE SODIUM 8.6-50 MG PO TABS
2.0000 | ORAL_TABLET | ORAL | Status: DC
  Administered 2015-05-21: 2 via ORAL
  Filled 2015-05-21: qty 2

## 2015-05-21 MED ORDER — OXYCODONE-ACETAMINOPHEN 5-325 MG PO TABS: 1.0000 | ORAL_TABLET | ORAL | Status: DC | PRN

## 2015-05-21 MED ORDER — TETANUS-DIPHTH-ACELL PERTUSSIS 5-2.5-18.5 LF-MCG/0.5 IM SUSP: 0.5000 mL | Freq: Once | INTRAMUSCULAR | Status: DC

## 2015-05-21 MED ORDER — OXYTOCIN 10 UNIT/ML IJ SOLN
2.5000 [IU]/h | INTRAVENOUS | Status: DC
  Filled 2015-05-21: qty 4

## 2015-05-21 MED ORDER — LACTATED RINGERS IV SOLN: INTRAVENOUS | Status: DC

## 2015-05-21 MED ORDER — ONDANSETRON HCL 4 MG/2ML IJ SOLN: 4.0000 mg | Freq: Four times a day (QID) | INTRAMUSCULAR | Status: DC | PRN

## 2015-05-21 NOTE — H&P (Signed)
LABOR AND DELIVERY ADMISSION HISTORY AND PHYSICAL NOTE  Bonnie [name] is a 30 y.o. female [phone] with IUP at 655w3d by L/6 presenting for painful frequent contractions.   She reports positive fetal movement. She denies leakage of fluid or vaginal bleeding.  Prenatal History/Complications:  Past Medical History: Past Medical History  Diagnosis Date  . Hyperemesis   . Preterm labor     Past Surgical History: Past Surgical History  Procedure Laterality Date  . Dilation and curettage of uterus      after TAB 2012    Obstetrical History: OB History    Gravida Para Term Preterm AB TAB SAB Ectopic Multiple Living   6 4 3 1 1 1  0 0 0 4      Social History: Social History   Social History  . Marital Status: Married    Spouse Name: N/A  . Number of Children: N/A  . Years of Education: N/A   Social History Main Topics  . Smoking status: Never Smoker   . Smokeless tobacco: Never Used  . Alcohol Use: No  . Drug Use: No  . Sexual Activity: Not Currently   Other Topics Concern  . None   Social History Narrative   Immigrant Social History:   - Name spelling correct?: Yes   - Date arrived in [place]: July 23, 2014   - Country of origin: [place]   - Location of refugee camp (if applicable), how long there, and what caused patient to leave home country?: Stayed in [place] for 15 days   - Primary language: Arabic    -Requires intepreter (essentially speaks no [place])   - Education: Highest level of education: [employer]   - Prior work: None   - Best family contact/phone number [phone]   - Tobacco/alcohol/drug use: No   - Marriage Status: Married   - Sexual activity: With husband   - Class B conditions: None   - Were you beaten or tortured in your country or refugee camp?  No    Family History: Family History  Problem Relation Age of Onset  . Heart disease Mother   . Deep vein thrombosis Mother   . Diabetes Mother   . Diabetes Brother     Allergies: No Known  Allergies  Prescriptions prior to admission  Medication Sig Dispense Refill Last Dose  . glyBURIDE (DIABETA) 5 MG tablet Take 1 tablet (5 mg total) by mouth 2 (two) times daily with a meal. 60 tablet 2 05/20/2015 at Unknown time  . ACCU-CHEK FASTCLIX LANCETS MISC 1 Units by Percutaneous route 4 (four) times daily. 100 each 12 Taking  . glucose blood (ACCU-CHEK SMARTVIEW) test strip Check blood sugars 4x/daily 100 each 12 Taking     Review of Systems   All systems reviewed and negative except as stated in HPI  Blood pressure 104/73, pulse 101, temperature 98.6 F (37 C), temperature source Oral, resp. rate 18, last menstrual period 09/01/2014. General appearance: alert, cooperative, appears stated age and mild distress Lungs: clear to auscultation bilaterally Heart: regular rate and rhythm Abdomen: soft, non-tender; bowel sounds normal Extremities: No calf swelling or tenderness Presentation: cephalic Fetal monitoring: 140/mod/+a/-d Uterine activity: q 3 min  Dilation: 5.5 Effacement (%): 80 Station: -2 Exam by:: Bonnie [name], RNC   Prenatal labs: ABO, Rh: O/Positive/-- (10/31 0000) Antibody: Negative (10/31 0000) Rubella: !Error!imm RPR: Nonreactive (10/31 0000)  HBsAg: Negative (10/31 0000)  HIV: Non-reactive (11/28 0000)  GBS: Negative (03/27 0000)  1 hr Glucola: 161 early,  3-hour wnl, 3rd tri 3-hour 83/205/227/137 Genetic screening:  Quad wnl Anatomy US: wnl  Prenatal Transfer Tool  Maternal Diabetes: yes Genetic Screening: wnl Maternal Ultrasounds/Referrals: Normal Fetal Ultrasounds or other Referrals:  None Maternal Substance Abuse:  No Significant Maternal Medications:  Meds include: Other: glyburide Significant Maternal Lab Results: Lab values include: Group B Strep negative  No results found for this or any previous visit (from the past 24 hour(s)).  Patient Active Problem List   Diagnosis Date Noted  . Active labor at term 05/21/2015  . Perinatal hepatitis  B exposure 05/15/2015  . Supervision of high-risk pregnancy 04/18/2015  . Gestational diabetes mellitus (GDM), antepartum 04/18/2015  . Language barrier 03/05/2015    Assessment: Bonnie [name] is a 30 y.o. [id number] at [redacted]w[redacted]d here for SOL  #Labor: active, expectant mgmt, female provider if possible, repeated that cannot guarantee #Pain: Non-pharm #FWB: cat1 #ID:  gbs neg #MOF: br #MOC: depo #Circ:  N/a #A2GDM: no grown scan, scheduled tomorrow. Will monitor glucose  Bonnie [name] 05/21/2015, 11:29 AM

## 2015-05-21 NOTE — MAU Note (Signed)
UC's since 0500. No bleeding or leaking. Gestational diabetic on oral med.

## 2015-05-22 ENCOUNTER — Other Ambulatory Visit: Payer: Medicaid Other

## 2015-05-22 ENCOUNTER — Ambulatory Visit (HOSPITAL_COMMUNITY): Payer: Medicaid Other

## 2015-05-22 LAB — CBC
HEMATOCRIT: 29.6 % — AB (ref 36.0–46.0)
HEMOGLOBIN: 9.1 g/dL — AB (ref 12.0–15.0)
MCH: 23.3 pg — ABNORMAL LOW (ref 26.0–34.0)
MCHC: 30.7 g/dL (ref 30.0–36.0)
MCV: 75.7 fL — ABNORMAL LOW (ref 78.0–100.0)
Platelets: 218 10*3/uL (ref 150–400)
RBC: 3.91 MIL/uL (ref 3.87–5.11)
RDW: 15.5 % (ref 11.5–15.5)
WBC: 15.3 10*3/uL — AB (ref 4.0–10.5)

## 2015-05-22 LAB — GLUCOSE, CAPILLARY: Glucose-Capillary: 110 mg/dL — ABNORMAL HIGH (ref 65–99)

## 2015-05-22 LAB — RPR: RPR Ser Ql: NONREACTIVE

## 2015-05-22 MED ORDER — IBUPROFEN 600 MG PO TABS: 600.0000 mg | ORAL_TABLET | Freq: Four times a day (QID) | ORAL | Status: AC | PRN

## 2015-05-22 NOTE — Discharge Instructions (Signed)

## 2015-05-22 NOTE — Discharge Summary (Signed)
OB Discharge Summary     Patient Name: Bonnie [name] DOB: 1986-01-06 MRN: [id number]  Date of admission: 05/21/2015 Delivering MD: Willadean Carol DODD   Date of discharge: 05/22/2015  Admitting diagnosis: 37WKS,LABOR Intrauterine pregnancy: [redacted]w[redacted]d     Secondary diagnosis:  Active Problems:   Active labor at term  Additional problems: A2GDM (glyburide)     Discharge diagnosis: Term Pregnancy Delivered and GDM A2                                                                                                Post partum procedures:none  Augmentation: AROM  Complications: None  Hospital course:  Onset of Labor With Vaginal Delivery     30 y.o. yo [id number] at [redacted]w[redacted]d was admitted in Active Labor on 05/21/2015. Patient had an uncomplicated labor course as follows:  Membrane Rupture Time/Date: 4:14 PM ,05/21/2015   Intrapartum Procedures: Episiotomy: None [1]                                         Lacerations:     Patient had a delivery of a Viable infant. 05/21/2015  Information for the patient's newborn:  Bonnie [name], Bonnie [name] [[id number]]  Delivery Method: Vaginal, Spontaneous Delivery (Filed from Delivery Summary)    Pateint had an uncomplicated postpartum course.  She is ambulating, tolerating a regular diet, passing flatus, and urinating well. Patient is discharged home in stable condition on 05/23/2015.    Physical exam  Filed Vitals:   05/21/15 1830 05/21/15 1955 05/21/15 2044 05/21/15 2356  BP: 114/84 101/67 102/59 104/66  Pulse: 98 74 85 70  Temp:  98.8 F (37.1 C) 98.6 F (37 C) 98.1 F (36.7 C)  TempSrc:  Oral Axillary Axillary  Resp:  SpO2:  100% 100% 100%   General: alert and cooperative Lochia: appropriate Uterine Fundus: firm Incision: N/A DVT Evaluation: No evidence of DVT seen on physical exam. Labs: Lab Results  Component Value Date   WBC 15.3* 05/22/2015   HGB 9.1* 05/22/2015   HCT 29.6* 05/22/2015   MCV 75.7* 05/22/2015   PLT 218 05/22/2015   CMP  Latest Ref Rng 11/08/2014  Glucose 65 - 99 mg/dL 85  BUN 6 - 20 mg/dL 10  Creatinine 1.30 - 8.65 mg/dL 7.84(O)  Sodium 962 - 952 mmol/L 133(L)  Potassium 3.5 - 5.1 mmol/L 3.6  Chloride 101 - 111 mmol/L 103  CO2 22 - 32 mmol/L 21(L)  Calcium 8.9 - 10.3 mg/dL 9.1  Total Protein 6.5 - 8.1 g/dL 7.4  Total Bilirubin 0.3 - 1.2 mg/dL 0.4  Alkaline Phos 38 - 126 U/L 50  AST 15 - 41 U/L 25  ALT 14 - 54 U/L 56(H)    Discharge instruction: per After Visit Summary and "Baby and Me Booklet".  After visit meds:    Medication List    STOP taking these medications        ACCU-CHEK FASTCLIX LANCETS Misc     glucose blood test strip  Commonly known as:  ACCU-CHEK SMARTVIEW     glyBURIDE 5 MG tablet  Commonly known as:  DIABETA      TAKE these medications        ibuprofen 600 MG tablet  Commonly known as:  ADVIL,MOTRIN  Take 1 tablet (600 mg total) by mouth every 6 (six) hours as needed.        Diet: routine diet  Activity: Advance as tolerated. Pelvic rest for 6 weeks.   Outpatient follow up:6 weeks Follow up Appt:Future Appointments Date Time Provider Department Center  05/22/2015 8:15 AM WH-[facility] US 3 WH-US 203  05/22/2015 9:30 AM WOC-WOCA NST WOC-WOCA WOC   Follow up Visit:No Follow-up on file.  Postpartum contraception: Depo Provera  Newborn Data: Live born female  Birth Weight: 8 lb 0.2 oz (3634 g) APGAR: 8, 9  Baby Feeding: Breast Disposition:home with mother   05/22/2015 Cam HaiSHAW, Ebenezer Mccaskey, CNM

## 2015-05-22 NOTE — Progress Notes (Signed)
Discharge instructions and papers discussed with patient and her husband. Used Clinical research associateArabic interpreter. Denies any other questions at this time. Infant is now a baby patient.

## 2015-05-22 NOTE — Lactation Note (Signed)
This note was copied from a baby's chart. Lactation Consultation Note Experienced BF mom states she BF her other children 2-3 yrs. Denies problems. Mom BF mom tired and cramping bad in a lot of pain. Left brochure. Patient Name: Bonnie [name] Reason for consult: Initial assessment   Maternal Data Has patient been taught Hand Expression?: Yes Does the patient have breastfeeding experience prior to this delivery?: Yes  Feeding Feeding Type: Breast Fed Length of feed: 15 min  LATCH Score/Interventions Latch: Grasps breast easily, tongue down, lips flanged, rhythmical sucking.  Audible Swallowing: A few with stimulation Intervention(s): Skin to skin  Type of Nipple: Everted at rest and after stimulation  Comfort (Breast/Nipple): Soft / non-tender     Hold (Positioning): No assistance needed to correctly position infant at breast. Intervention(s): Skin to skin;Support Pillows;Position options  LATCH Score: 9  Lactation Tools Discussed/Used     Consult Status Consult Status: PRN Date: 05/22/15 Follow-up type: In-patient    Charyl DancerCARVER, Bonnie [name] [name], 3:53 AM

## 2015-05-23 ENCOUNTER — Ambulatory Visit: Payer: Self-pay

## 2015-05-23 NOTE — Lactation Note (Signed)
This note was copied from a baby's chart. Lactation Consultation Note  Mom states breastfeeding is going well.  Denies questions or concerns.  Baby nursing frequently.  Patient Name: Bonnie [name]     Maternal Data    Feeding Feeding Type: Breast Fed Length of feed: 20 min  LATCH Score/Interventions Latch: Repeated attempts needed to sustain latch, nipple held in mouth throughout feeding, stimulation needed to elicit sucking reflex.  Audible Swallowing: A few with stimulation  Type of Nipple: Everted at rest and after stimulation  Comfort (Breast/Nipple): Soft / non-tender     Hold (Positioning): No assistance needed to correctly position infant at breast.  LATCH Score: 8  Lactation Tools Discussed/Used     Consult Status      Huston FoleyMOULDEN, Pia Jedlicka [name], 9:43 AM

## 2015-05-24 ENCOUNTER — Ambulatory Visit: Payer: Self-pay

## 2015-05-24 ENCOUNTER — Encounter: Payer: Self-pay | Admitting: *Deleted

## 2015-05-24 NOTE — Lactation Note (Signed)
This note was copied from a baby's chart. Lactation Consultation Note  Patient Name: Bonnie Gardiner CoinsSamah Atallah ZOXWR'UToday's Date: [name] Reason for consult: Follow-up assessment IPad interpreter used. Baby at 72 hr of life and mom is reporting breast pain. She stated that the "baby is not drinking all the milk". Baby latches easily and gulping is heard. She asked for a DEBP. Encouraged mom to latch on demand 8+/24hr for as long as baby wants then post pump. Discussed milk storage options. She is aware of OP services and support group. She will call as needed for bf support.   Maternal Data    Feeding Feeding Type: Breast Fed Length of feed: 15 min  LATCH Score/Interventions Latch: Grasps breast easily, tongue down, lips flanged, rhythmical sucking.  Audible Swallowing: Spontaneous and intermittent  Type of Nipple: Everted at rest and after stimulation  Comfort (Breast/Nipple): Filling, red/small blisters or bruises, mild/mod discomfort  Problem noted: Filling;Mild/Moderate discomfort Interventions (Filling): Double electric pump Interventions (Mild/moderate discomfort): Hand expression  Hold (Positioning): No assistance needed to correctly position infant at breast.  LATCH Score: 9  Lactation Tools Discussed/Used Pump Review: Setup, frequency, and cleaning;Milk Storage Initiated by:: ES Date initiated:: 05/24/15   Consult Status Consult Status: Follow-up Date: 05/25/15 Follow-up type: In-patient    Rulon Eisenmengerlizabeth E Johna Kearl [name], 6:06 PM

## 2015-07-07 ENCOUNTER — Ambulatory Visit: Payer: Medicaid Other | Admitting: Obstetrics & Gynecology

## 2015-07-07 ENCOUNTER — Telehealth: Payer: Self-pay | Admitting: General Practice

## 2015-07-07 NOTE — Telephone Encounter (Signed)
Called patient and a family member answered stating she didn't know where she was supposed to go today for the appt and she wants the appt rescheduled. Told her someone will contact the patient to reschedule. She verbalized understanding & had no questions

## 2015-09-01 NOTE — Telephone Encounter (Signed)
ERROR

## 2015-12-10 ENCOUNTER — Ambulatory Visit (INDEPENDENT_AMBULATORY_CARE_PROVIDER_SITE_OTHER): Payer: Medicaid Other | Admitting: *Deleted

## 2015-12-10 DIAGNOSIS — Z23 Encounter for immunization: Secondary | ICD-10-CM | POA: Diagnosis present

## 2015-12-16 ENCOUNTER — Ambulatory Visit (INDEPENDENT_AMBULATORY_CARE_PROVIDER_SITE_OTHER): Payer: Medicaid Other | Admitting: Internal Medicine

## 2015-12-16 ENCOUNTER — Encounter: Payer: Self-pay | Admitting: Internal Medicine

## 2015-12-16 VITALS — BP 100/56 | HR 82 | Temp 98.0°F | Wt 165.6 lb

## 2015-12-16 DIAGNOSIS — Z3200 Encounter for pregnancy test, result unknown: Secondary | ICD-10-CM | POA: Diagnosis not present

## 2015-12-16 DIAGNOSIS — Z3009 Encounter for other general counseling and advice on contraception: Secondary | ICD-10-CM | POA: Insufficient documentation

## 2015-12-16 LAB — POCT URINE PREGNANCY: PREG TEST UR: NEGATIVE

## 2015-12-16 NOTE — Progress Notes (Signed)
   [facility] Phone: [phone]  Subjective:  Bonnie BottcherSamah is a 30 year old female presenting to clinic with concerns that she might be pregnant. Her LMP was 10/17/2015. She has a 356 month old child and is currently breastfeeding. She is sexually active with her husband. She is interested in starting a form of birth control, as she does not want to be pregnant at this time.  ROS: See HPI for pertinent positives and negatives  Past Medical History- GDM  Family history reviewed for today's visit. No changes.  Social history- patient is a never smoker  Objective: BP (!) 100/56 (BP Location: Right Arm, Patient Position: Sitting, Cuff Size: Normal)   Pulse 82   Temp 98 F (36.7 C) (Oral)   Wt 165 lb 9.6 oz (75.1 kg)   LMP 10/17/2015 (Exact Date)   Breastfeeding? Yes   BMI 28.43 kg/m  Gen: NAD, alert, cooperative with exam HEENT: NCAT, EOMI, MMM Resp: Normal work of breathing Neuro: Alert and oriented, no gross deficits Psych: Appropriate behavior  Assessment/Plan: Possible pregnancy: LMP 10/17/2015. Currently breastfeeding her 256 month old infant.  - Pregnancy test performed in clinic today and was normal - Counseled patient on different birth control options - Pt interested in IUD. Appointment scheduled to have this placed 11/1 at 08:45. Pt did not have time to have this placed today.   Bonnie [name] Mayo, MD PGY-2

## 2015-12-16 NOTE — Patient Instructions (Signed)
It was nice to meet you!  I have scheduled you an appointment for tomorrow at 8:45AM  -Dr. Nancy MarusMayo

## 2015-12-16 NOTE — Assessment & Plan Note (Signed)
LMP 10/17/2015. Currently breastfeeding her 386 month old infant.  - Pregnancy test performed in clinic today and was normal - Counseled patient on different birth control options - Pt interested in IUD. Appointment scheduled to have this placed 11/1 at 08:45. Pt did not have time to have this placed today.

## 2015-12-17 ENCOUNTER — Ambulatory Visit (INDEPENDENT_AMBULATORY_CARE_PROVIDER_SITE_OTHER): Payer: Self-pay | Admitting: Family Medicine

## 2015-12-17 ENCOUNTER — Encounter: Payer: Medicaid Other | Admitting: Family Medicine

## 2015-12-17 DIAGNOSIS — Z3009 Encounter for other general counseling and advice on contraception: Secondary | ICD-10-CM

## 2015-12-17 NOTE — Progress Notes (Signed)
    Subjective: CC: IUD placement HPI: Patient is a 30 y.o. female presenting to clinic today for IUD placement. She was seen in our clinic yesterday with concerns for pregnancy as LMP was 10/17/15, however Upreg was negative. She's currently sexually active with her husband and is breastfeeding her 786 month old child.   The patient has many questions about the IUD. She states that she's had the IUD before, and she got "scared" because she bled for 4 weeks straight. She notes that she is unable to pray while she is bleeding. She has several other questions about the IUD and then ask about the Nexplanon. We discussed that typically he may have abnormal bleeding initially after IUD insertion, but after that patient's menses tend to become very scant. I note that the Nexplanon patient's tend to have more abnormal bleeding. We discuss progesterone only OCPs (as she's still breast feeding) and DepoProvera shots q3 months.   Social History: never smoker. ROS: All other systems reviewed and are negative besides that noted in HPI.  Past Medical History Patient Active Problem List   Diagnosis Date Noted  . Birth control counseling 12/16/2015  . Active labor at term 05/21/2015  . 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 Supervision of high-risk pregnancy 04/18/2015  . Gestational diabetes mellitus (GDM), antepartum 04/18/2015  . Language barrier 03/05/2015    Medications- reviewed and updated Current Outpatient Prescriptions  Medication Sig Dispense Refill  . ibuprofen (ADVIL,MOTRIN) 600 MG tablet Take 1 tablet (600 mg total) by mouth every 6 (six) hours as needed. 30 tablet 0   No current facility-administered medications for this visit.     Objective: Office vital signs reviewed. BP 104/65 (BP Location: Left Arm, Patient Position: Sitting, Cuff Size: Normal)   Pulse 82   Temp 98.3 F (36.8 C) (Oral)   Ht 5\' 6"  (1.676 m)   Wt 166 lb 9.6 oz (75.6 kg)   LMP 10/17/2015 (Exact Date)    SpO2 98%   BMI 26.89 kg/m    Physical Examination:  General: Awake, alert, well- nourished, NAD  Upreg 12/16/15 negative  Assessment/Plan: Birth control counseling Provided additional counseling with the patient on a different contraceptive options. After 10/15 minute conversation, the patient opts for IUD insertion. Unfortunately, the patient is not noted to have Medicaid at this time. She states that her Medicaid is supposed to be active through April 2018. The patient states that she will get in touch with her social worker to get the appropriate paperwork. She'll make a follow-up with me for IUD insertion. We have discussed the process of IUD insertion, which should make consenting much easier. - pt has an appt on 01/07/16 for IUD insertion, advised to contact our office if Medicaid is not set up prior to that. - discussed condoms as back on contraception until that time - will need to get a repeat urine pregnancy test prior to that IUD insertion.   No orders of the defined types were placed in this encounter.   No orders of the defined types were placed in this encounter.   Joanna Puffrystal S. Dezeray Puccio PGY-3, [facility]

## 2015-12-17 NOTE — Progress Notes (Deleted)
    Subjective: CC: IUD placement HPI: Patient is a 30 y.o. female presenting to clinic today for IUD placement. She was seen in our clinic yesterday with concerns for pregnancy as LMP was 10/17/15, however Upreg was negative. She's currently sexually active with her husband and is breastfeeding her 446 month old child.     Social History: never smoker  ROS: All other systems reviewed and are negative.  Past Medical History Patient Active Problem List   Diagnosis Date Noted  . Birth control counseling 12/16/2015  . Active labor at term 05/21/2015  . 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 Supervision of high-risk pregnancy 04/18/2015  . Gestational diabetes mellitus (GDM), antepartum 04/18/2015  . Language barrier 03/05/2015    Medications- reviewed and updated Current Outpatient Prescriptions  Medication Sig Dispense Refill  . ibuprofen (ADVIL,MOTRIN) 600 MG tablet Take 1 tablet (600 mg total) by mouth every 6 (six) hours as needed. 30 tablet 0   No current facility-administered medications for this visit.     Objective: Office vital signs reviewed. LMP 10/17/2015 (Exact Date)    Physical Examination:  General: Awake, alert, *** nourished, NAD ENMT:  TMs intact, normal light reflex, no erythema, no bulging. Nasal turbinates moist. MMM, Oropharynx clear without erythema or tonsillar exudate/hypertrophy Eyes: Conjunctiva non-injected. PERRL.  Cardio: RRR, no m/r/g noted.  Pulm: No increased WOB.  CTAB, without wheezes, rhonchi or crackles noted.  GI: soft, NT/ND,+BS x4, no hepatomegaly, no splenomegaly GU: deffered Extremities: WWP, No edema, cyanosis or clubbing; +*** pulses bilaterally MSK: Normal gait and station Skin: dry, intact, no rashes or lesions Neuro: Strength and sensation grossly intact, DTRs ***/4  Assessment/Plan: No problem-specific Assessment & Plan notes found for this encounter.   No orders of the defined types were placed in this  encounter.   No orders of the defined types were placed in this encounter.   Joanna Puffrystal S. Andi Layfield PGY-3, [facility]   This encounter was created in error - please disregard.

## 2015-12-17 NOTE — Progress Notes (Signed)
Error

## 2015-12-17 NOTE — Assessment & Plan Note (Signed)
Provided additional counseling with the patient on a different contraceptive options. After 10/15 minute conversation, the patient opts for IUD insertion. Unfortunately, the patient is not noted to have Medicaid at this time. She states that her Medicaid is supposed to be active through April 2018. The patient states that she will get in touch with her social worker to get the appropriate paperwork. She'll make a follow-up with me for IUD insertion. We have discussed the process of IUD insertion, which should make consenting much easier. - pt has an appt on 01/07/16 for IUD insertion, advised to contact our office if Medicaid is not set up prior to that. - discussed condoms as back on contraception until that time - will need to get a repeat urine pregnancy test prior to that IUD insertion.

## 2016-01-07 ENCOUNTER — Telehealth: Payer: Self-pay | Admitting: Family Medicine

## 2016-01-07 ENCOUNTER — Ambulatory Visit: Payer: Self-pay | Admitting: Family Medicine

## 2016-01-07 MED ORDER — PERMETHRIN 5 % EX CREA: TOPICAL_CREAM | CUTANEOUS | 0 refills | Status: DC

## 2016-01-07 NOTE — Telephone Encounter (Signed)
Family member has lice - each member of family needs treatment. Husband confirms no allergies Will rx permethrin for this patient  Latrelle DodrillBrittany J Othell Diluzio, MD

## 2016-04-07 ENCOUNTER — Ambulatory Visit (INDEPENDENT_AMBULATORY_CARE_PROVIDER_SITE_OTHER): Payer: Medicaid Other | Admitting: Obstetrics and Gynecology

## 2016-04-07 VITALS — BP 110/70 | HR 82 | Temp 98.3°F | Wt 168.0 lb

## 2016-04-07 DIAGNOSIS — Z3009 Encounter for other general counseling and advice on contraception: Secondary | ICD-10-CM

## 2016-04-07 DIAGNOSIS — Z30430 Encounter for insertion of intrauterine contraceptive device: Secondary | ICD-10-CM | POA: Diagnosis not present

## 2016-04-07 LAB — POCT URINE PREGNANCY: Preg Test, Ur: NEGATIVE

## 2016-04-07 MED ORDER — LEVONORGESTREL 20 MCG/24HR IU IUD
INTRAUTERINE_SYSTEM | Freq: Once | INTRAUTERINE | Status: AC
  Administered 2016-04-07: 1 via INTRAUTERINE

## 2016-04-07 NOTE — Progress Notes (Signed)
First IUD came out with cutting of strings. Patient has a multiparus cervix which also could have caused the easy dislodging of first IUD. Second IUD placed without complications.

## 2016-04-07 NOTE — Progress Notes (Signed)
Placement of Mirena IUD  Indication: Contraception Assisted by: Ruthy DickPaige Scott  Patient was counseled regarding the risks/benefits/alternatives of the IUD. Urine pregnancy test negative . Consent was obtained and all questions answered.  Time out performed.  Description: Cervix was swabbed three times with Betadine swabs. Sterile gloves donned. Sterile single-tooth tenaculum used to grasp anterior lip of the cervix and straighten the endocervical canal. Uterus sounded to 7 cm. IUD loaded per manufacturer's instruction and flange set to  7 cm. IUD placed per manufacturer's directions. Strings trimmed to 3 cm. Patient tolerated the procedure well.    Follow-up: Patient counseled regarding techniques for self-monitoring of IUD and given precautions. Patient notified of removal date and given card. Patient will follow-up in 4 weeks for string check. AVS given with aftercare instructions  Caryl AdaJazma Gates Jividen, DO 04/07/2016, 9:55 AM PGY-3, [facility]

## 2016-04-07 NOTE — Patient Instructions (Signed)
IUD AFTERCARE  1.  Uterine cramping is common after IUD placement.  You  May using heating pads, ibuprofen, and tylenol.  If it becomes very painful, you notice fever or chills, unusual bleeding, or foul smelling vaginal discharge please call the office. 2.  Irregular vaginal bleeding is common in the first few months after IUD placement but will often get better in the first six months.   3.  IUDs do not protect against STD such as HIV, genital warts, gonorrhea, chlamydia, herpes.  Please continue to protect yourself against these infections and contact the office if you think you may have contracted an infection. 4.  Checking for proper placement:  You may feel for your IUD strings as shown today by placing you fingers into your vagina.  Do not pull on the strings.  If your Mirena falls out, you can feel the hard plastic part of the IUD, or you can no longer feel the strings, please contact the office. 5.  Your Mirena should be removed or replaced in 5 years. 6.  Please see package insert or [email or web address] for full patient information. 7.  Make follow-up appointment for 4 weeks.  

## 2016-06-17 ENCOUNTER — Ambulatory Visit (INDEPENDENT_AMBULATORY_CARE_PROVIDER_SITE_OTHER): Payer: Medicaid Other | Admitting: Family Medicine

## 2016-06-17 VITALS — BP 104/64 | HR 67 | Temp 98.0°F | Wt 168.2 lb

## 2016-06-17 DIAGNOSIS — N939 Abnormal uterine and vaginal bleeding, unspecified: Secondary | ICD-10-CM

## 2016-06-17 LAB — POCT URINE PREGNANCY: Preg Test, Ur: NEGATIVE

## 2016-06-17 LAB — POCT HEMOGLOBIN: HEMOGLOBIN: 12.2 g/dL (ref 12.2–16.2)

## 2016-06-17 MED ORDER — NAPROXEN 500 MG PO TABS: 500.0000 mg | ORAL_TABLET | Freq: Two times a day (BID) | ORAL | 0 refills | Status: DC

## 2016-06-17 MED ORDER — NORGESTIMATE-ETH ESTRADIOL 0.25-35 MG-MCG PO TABS: 1.0000 | ORAL_TABLET | Freq: Every day | ORAL | 11 refills | Status: DC

## 2016-06-17 NOTE — Patient Instructions (Addendum)
Start the two medications.  Come back if symptoms not improving in 7-10 days.  Take care,  Dr Jimmey RalphParker

## 2016-06-17 NOTE — Progress Notes (Signed)
   Subjective:  Bonnie [name] is a 31 y.o. female [facility] chief complaint of vaginal bleeding. History is provided by the patient via video spanish interpretor.   HPI:  Vaginal Bleeding Symptoms started 2 months ago after having her IUD placed. Since then, has had constant bleeding and mild low abdominal cramping. No fevers or chills. No nausea or vomiting. No dizziness or lightheadedness. No treatments tried. Symptoms have stayed the same for the past 2 months. Would like for her bleeding to stop within the next 10 days because she will be fasting.   ROS: Per HPI  PMH: Smoking history reviewed.   Objective:  Physical Exam: BP 104/64   Pulse 67   Temp 98 F (36.7 C) (Oral)   Wt 168 lb 3.2 oz (76.3 kg)   SpO2 98%   BMI 27.15 kg/m   Gen: NAD, resting comfortably CV: RRR with no murmurs appreciated Pulm: NWOB, CTAB with no crackles, wheezes, or rhonchi GI: Normal bowel sounds present. Soft, Nontender, Nondistended. MSK: no edema, cyanosis, or clubbing noted Skin: warm, dry Neuro: grossly normal, moves all extremities Psych: Normal affect and thought content  Results for orders placed or performed in visit on 06/17/16 (from the past 72 hour(s))  POCT urine pregnancy     Status: None   Collection Time: 06/17/16  9:12 AM  Result Value Ref Range   Preg Test, Ur Negative Negative  POCT hemoglobin     Status: None   Collection Time: 06/17/16  9:20 AM  Result Value Ref Range   Hemoglobin 12.2 12.2 - 16.2 g/dL   Assessment/Plan:  Vaginal Bleeding upreg normal. Hgb stable. Hemodynamically stable. Symptoms likely secondary to common adverse event of IUD placement. Reassured patient that this was normal and likely to stop within the next few weeks. Given that patient would like for bleeding to stop so that she can fast and said that she would have the IUD removed if the bleeding dose not stop, will start naproxen 500mg  bid for 5 days and start OCPs. Follow up  in 1-2 weeks. Return precautions reviewed.   Katina Degreealeb M. Jimmey RalphParker, MD [facility] Resident PGY-3 06/17/2016 9:47 AM

## 2016-10-25 IMAGING — US US ABDOMEN COMPLETE
1 series · 15 of 25 positions shown · non-contrast
Comparison: None.

CLINICAL DATA: Right upper quadrant pain with increasing vomiting
for least 2 weeks. Nine weeks pregnant.

EXAM:
ULTRASOUND ABDOMEN COMPLETE

[Series 1: us abdomen complete · 15 of 94 slices shown]
[im 1/94]
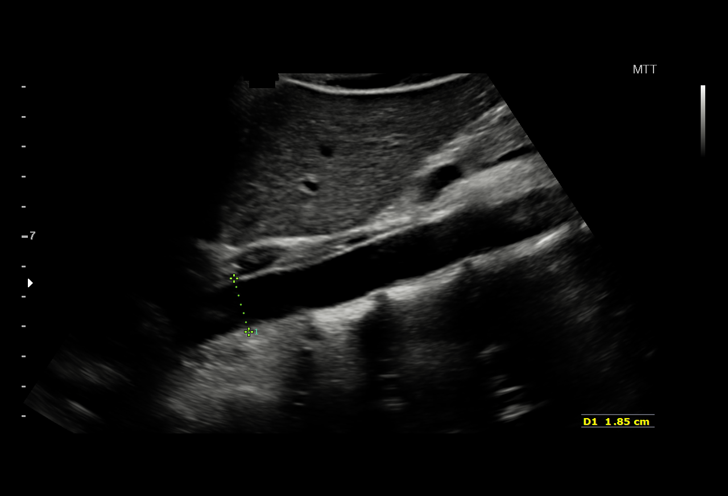
[im 8/94]
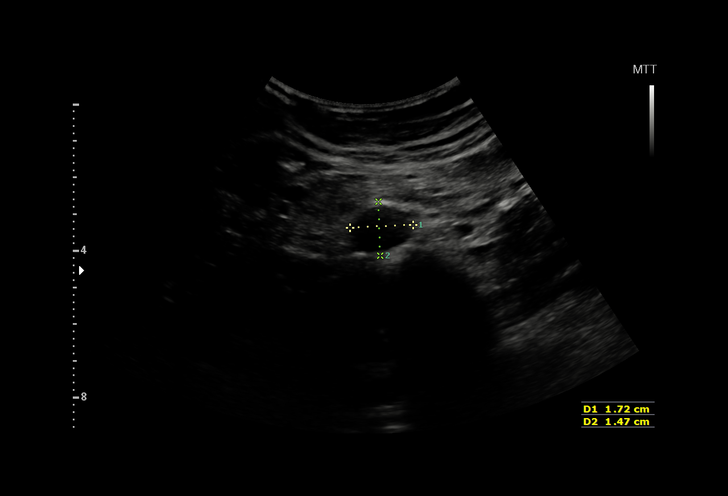
[im 16/94]
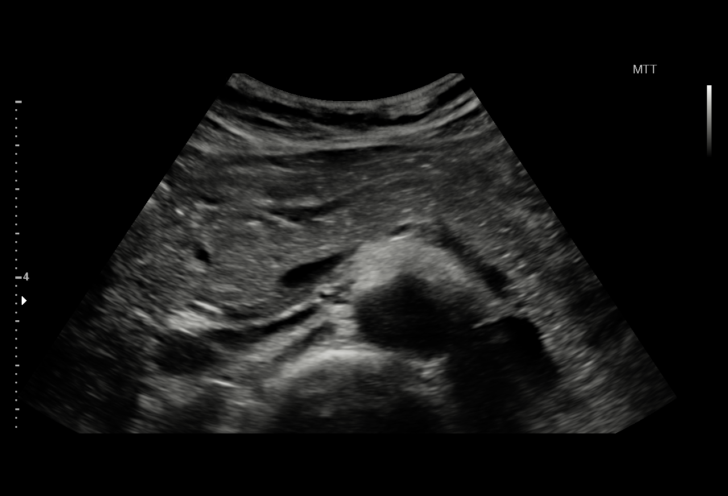
[im 20/94]
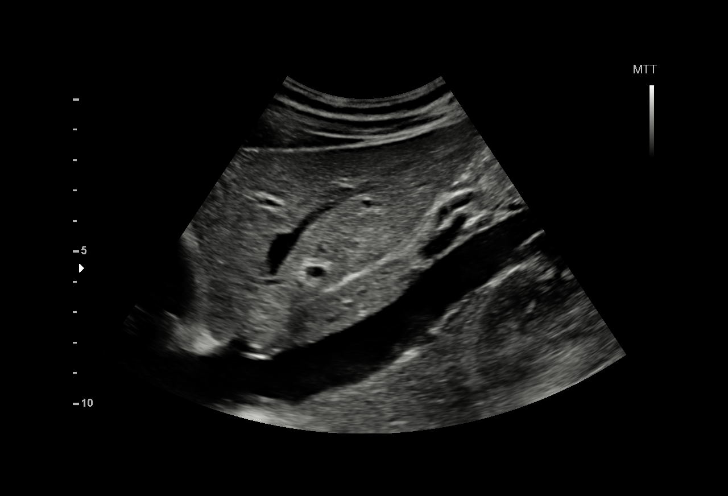
[im 28/94]
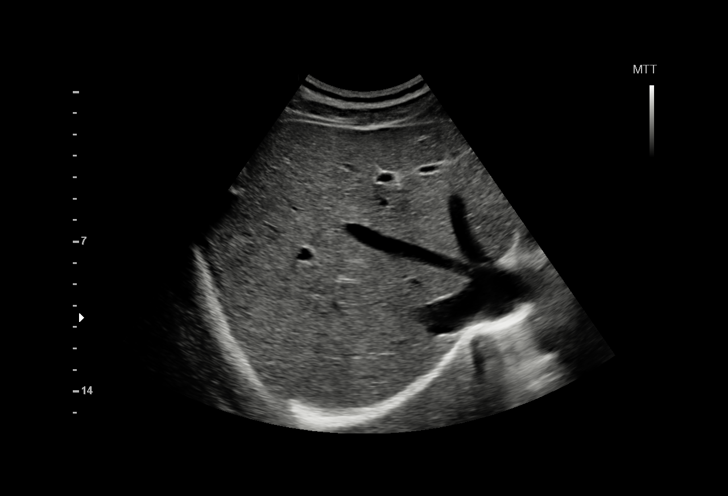
[im 35/94]
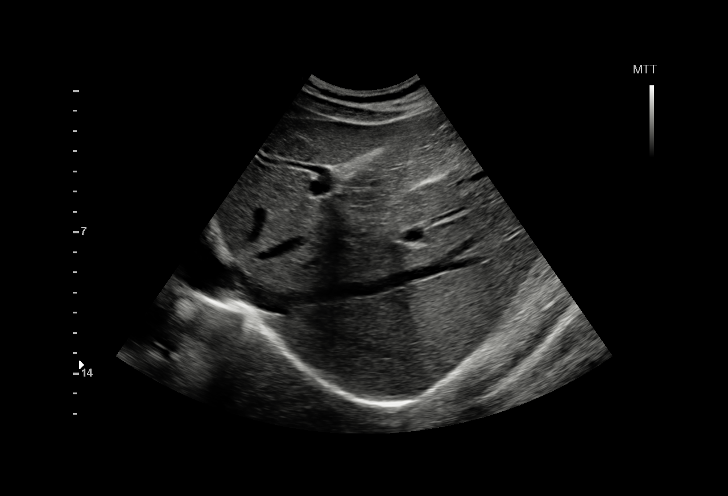
[im 39/94]
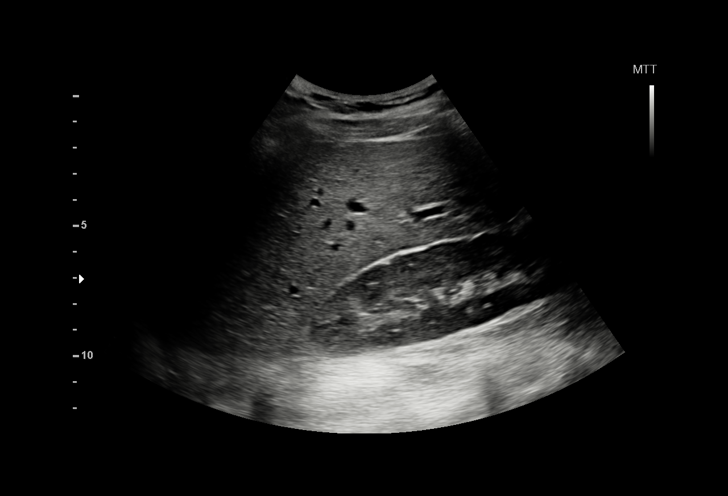
[im 47/94]
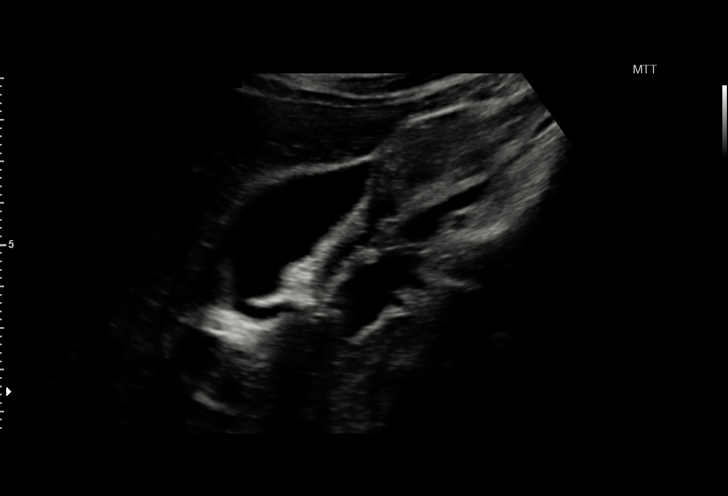
[im 55/94]
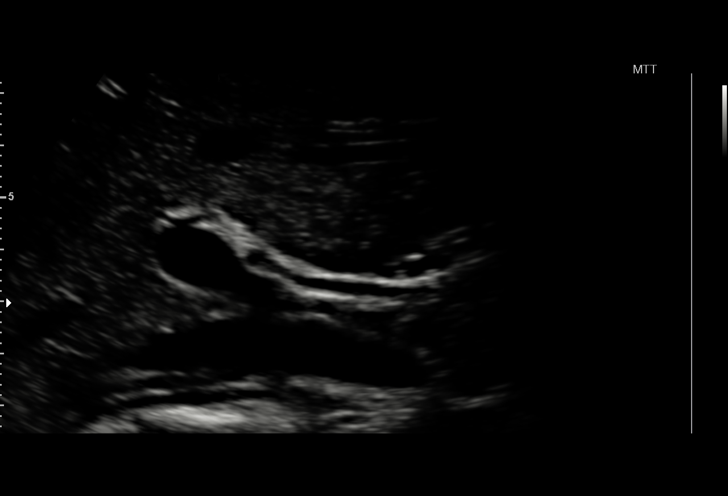
[im 59/94]
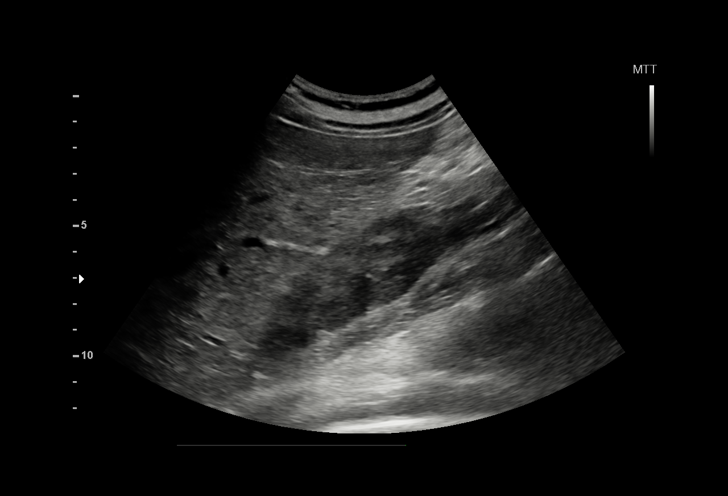
[im 66/94]
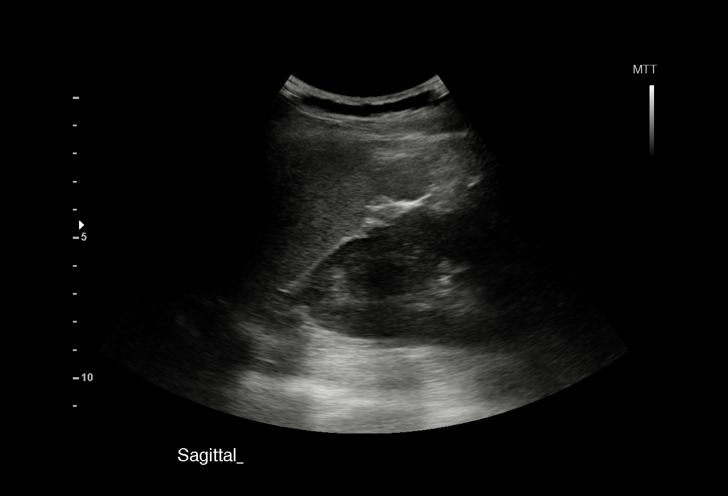
[im 74/94]
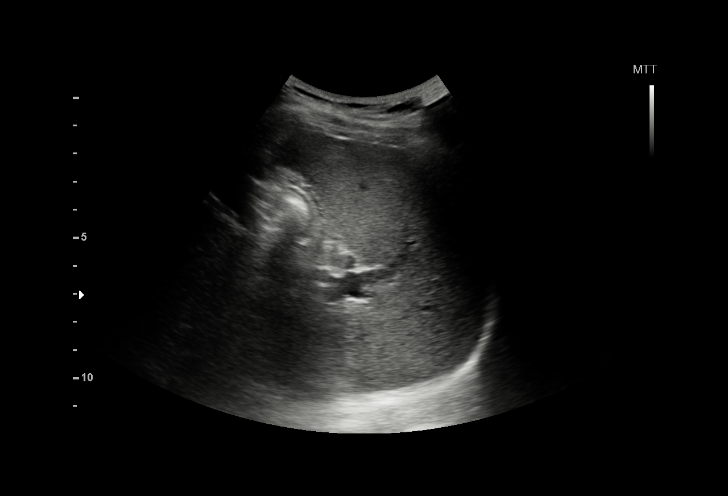
[im 78/94]
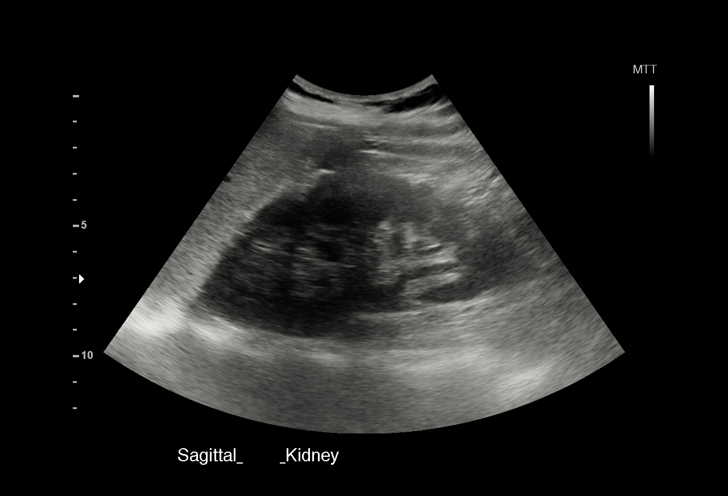
[im 86/94]
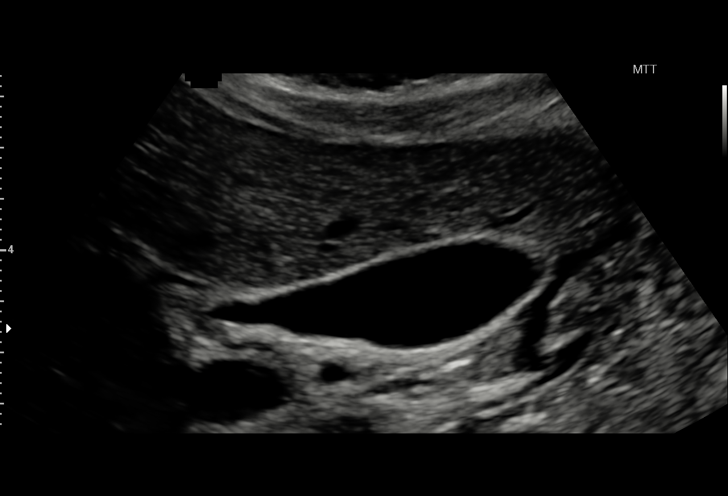
[im 94/94]
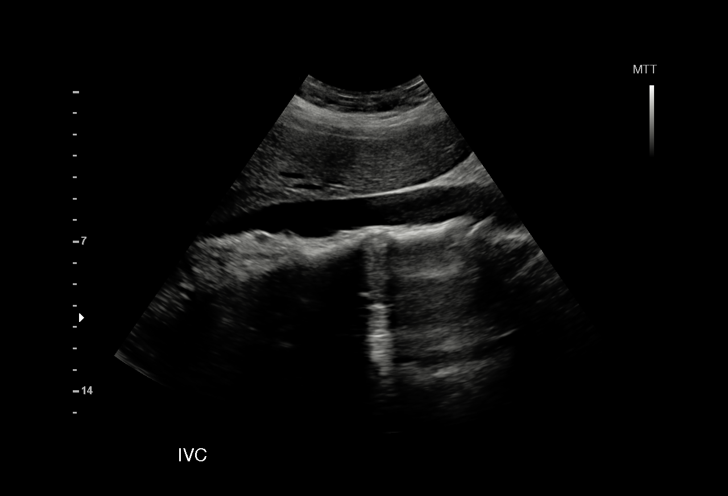

[15 of 25 positions shown; findings below may reference images not displayed]

FINDINGS: Gallbladder: No gallstones or wall thickening visualized. No
sonographic Murphy sign noted.

Common bile duct: Diameter: 2 mm, normal

Liver: No focal lesion identified. Within normal limits in
parenchymal echogenicity.

IVC: No abnormality visualized.

Pancreas: Visualized portion unremarkable.

Spleen: Size and appearance within normal limits. An accessory
spleen is noted.

Right Kidney: Length: 12.2 cm. Echogenicity within normal limits. No
mass or hydronephrosis visualized.

Left Kidney: Length: 12.1 cm. Echogenicity within normal limits. No
mass or hydronephrosis visualized.

Abdominal aorta: No aneurysm visualized.

Other findings: None.
IMPRESSION: Normal examination.

## 2017-08-15 ENCOUNTER — Ambulatory Visit (INDEPENDENT_AMBULATORY_CARE_PROVIDER_SITE_OTHER): Payer: Medicaid Other | Admitting: Advanced Practice Midwife

## 2017-08-15 ENCOUNTER — Other Ambulatory Visit (HOSPITAL_COMMUNITY)
Admission: RE | Admit: 2017-08-15 | Discharge: 2017-08-15 | Disposition: A | Discharge status: Home / Self Care | Payer: Medicaid Other | Source: Ambulatory Visit | Attending: Advanced Practice Midwife | Admitting: Advanced Practice Midwife

## 2017-08-15 ENCOUNTER — Encounter: Payer: Self-pay | Admitting: Advanced Practice Midwife

## 2017-08-15 VITALS — BP 113/74 | HR 89 | Wt 160.0 lb

## 2017-08-15 DIAGNOSIS — Z30431 Encounter for routine checking of intrauterine contraceptive device: Secondary | ICD-10-CM | POA: Diagnosis present

## 2017-08-15 DIAGNOSIS — M549 Dorsalgia, unspecified: Secondary | ICD-10-CM | POA: Diagnosis not present

## 2017-08-15 NOTE — Progress Notes (Signed)
GYNECOLOGY OFFICE PROGRESS NOTE  History:  32 y.o. [id number] here today for today for IUD string check; Mirena IUD was placed  04/07/2016.  She is having low back pain.   She reports that she has had several yeast infections that her PCP is treating and they told her that she should have her IUD checked to make sure it was in the right place.   The following portions of the patient's history were reviewed and updated as appropriate: allergies, current medications, past family history, past medical history, past social history, past surgical history and problem list. Last pap smear on 08/2014 was normal.  Review of Systems:  Pertinent items are noted in HPI.   Objective:  Physical Exam Blood pressure 113/74, pulse 89, weight 160 lb (72.6 kg), currently breastfeeding. CONSTITUTIONAL: Well-developed, well-nourished female in no acute distress.  HENT:  Normocephalic, atraumatic. External right and left ear normal. Oropharynx is clear and moist EYES: Conjunctivae and EOM are normal. Pupils are equal, round, and reactive to light. No scleral icterus.  NECK: Normal range of motion, supple, no masses CARDIOVASCULAR: Normal heart rate noted RESPIRATORY: Effort and breath sounds normal, no problems with respiration noted ABDOMEN: Soft, no distention noted.   PELVIC: Normal appearing external genitalia; normal appearing vaginal mucosa and cervix.  IUD strings visualized, about 2 cm in length outside cervix.   Assessment & Plan:  Normal IUD check. Patient would like to keep the IUD at this time. Routine preventative health maintenance measures emphasized. Pap collected today   Thressa ShellerHeather Ardit Danh 3:37 PM 08/15/17

## 2017-08-17 LAB — CYTOLOGY - PAP
DIAGNOSIS: NEGATIVE
HPV (WINDOPATH): NOT DETECTED
